# Patient Record
Sex: Female | Born: 1950 | Race: White | Hispanic: No | Smoking: Never smoker
Health system: Southern US, Community
[De-identification: ages and names within clinical notes are randomized; demographics above are authoritative.]

## PROBLEM LIST (undated history)

## (undated) DIAGNOSIS — I700 Atherosclerosis of aorta: Secondary | ICD-10-CM

## (undated) DIAGNOSIS — K219 Gastro-esophageal reflux disease without esophagitis: Secondary | ICD-10-CM

## (undated) DIAGNOSIS — K648 Other hemorrhoids: Secondary | ICD-10-CM

## (undated) DIAGNOSIS — T7840XA Allergy, unspecified, initial encounter: Secondary | ICD-10-CM

## (undated) DIAGNOSIS — H269 Unspecified cataract: Secondary | ICD-10-CM

## (undated) DIAGNOSIS — R011 Cardiac murmur, unspecified: Secondary | ICD-10-CM

## (undated) DIAGNOSIS — K602 Anal fissure, unspecified: Secondary | ICD-10-CM

## (undated) DIAGNOSIS — K449 Diaphragmatic hernia without obstruction or gangrene: Secondary | ICD-10-CM

## (undated) DIAGNOSIS — M810 Age-related osteoporosis without current pathological fracture: Secondary | ICD-10-CM

## (undated) HISTORY — DX: Allergy, unspecified, initial encounter: T78.40XA

## (undated) HISTORY — DX: Unspecified cataract: H26.9

## (undated) HISTORY — PX: BREAST ENHANCEMENT SURGERY: SHX7

## (undated) HISTORY — PX: WISDOM TOOTH EXTRACTION: SHX21

## (undated) HISTORY — DX: Other hemorrhoids: K64.8

## (undated) HISTORY — PX: COSMETIC SURGERY: SHX468

## (undated) HISTORY — DX: Age-related osteoporosis without current pathological fracture: M81.0

## (undated) HISTORY — DX: Gastro-esophageal reflux disease without esophagitis: K21.9

## (undated) HISTORY — PX: BASAL CELL CARCINOMA EXCISION: SHX1214

## (undated) HISTORY — PX: TUBAL LIGATION: SHX77

## (undated) HISTORY — PX: EYE SURGERY: SHX253

## (undated) HISTORY — DX: Anal fissure, unspecified: K60.2

## (undated) HISTORY — PX: AUGMENTATION MAMMAPLASTY: SUR837

## (undated) HISTORY — DX: Cardiac murmur, unspecified: R01.1

## (undated) HISTORY — DX: Diaphragmatic hernia without obstruction or gangrene: K44.9

## (undated) HISTORY — PX: COLONOSCOPY: SHX174

## (undated) HISTORY — DX: Atherosclerosis of aorta: I70.0

---

## 2017-01-21 ENCOUNTER — Ambulatory Visit (INDEPENDENT_AMBULATORY_CARE_PROVIDER_SITE_OTHER): Payer: Medicare Other | Admitting: Obstetrics & Gynecology

## 2017-01-21 ENCOUNTER — Encounter: Payer: Self-pay | Admitting: Obstetrics & Gynecology

## 2017-01-21 ENCOUNTER — Other Ambulatory Visit: Payer: Self-pay | Admitting: Obstetrics & Gynecology

## 2017-01-21 VITALS — BP 122/55 | HR 83 | Ht 64.0 in | Wt 127.7 lb

## 2017-01-21 DIAGNOSIS — Z01419 Encounter for gynecological examination (general) (routine) without abnormal findings: Secondary | ICD-10-CM

## 2017-01-21 DIAGNOSIS — Z0000 Encounter for general adult medical examination without abnormal findings: Secondary | ICD-10-CM | POA: Diagnosis not present

## 2017-01-21 DIAGNOSIS — Z1231 Encounter for screening mammogram for malignant neoplasm of breast: Secondary | ICD-10-CM

## 2017-01-21 NOTE — Progress Notes (Signed)
Subjective:     Virginia Fox is a 66 y.o. female here for a routine exam.  G2P2 LMP in her early 76's.  Current complaints: none.  Widowed. Last sexually active 2004. No FH of breast CA an any female CA.  No bleeding since menopause.   Gynecologic History No LMP recorded. Contraception: post menopausal status Last Pap: 2015. Results were: normal Last mammogram: 2016. Results were: normal  Obstetric History OB History  Gravida Para Term Preterm AB Living  2 2 2  0 0 2  SAB TAB Ectopic Multiple Live Births  0 0 0 0 2    # Outcome Date GA Lbr Len/2nd Weight Sex Delivery Anes PTL Lv  2 Term           1 Term              The following portions of the patient's history were reviewed and updated as appropriate: allergies, current medications, past family history, past medical history, past social history, past surgical history and problem list.  Review of Systems Pertinent items are noted in HPI.    Objective:   BP (!) 122/55   Pulse 83   Ht 5' 4"  (1.626 m)   Wt 127 lb 11.2 oz (57.9 kg)   BMI 21.92 kg/m  General Appearance:    Alert, cooperative, no distress, appears stated age  Head:    Normocephalic, without obvious abnormality, atraumatic  Eyes:    conjunctiva/corneas clear, EOM's intact, both eyes  Ears:    Normal external ear canals, both ears  Nose:   Nares normal, septum midline, mucosa normal, no drainage    or sinus tenderness  Throat:   Lips, mucosa, and tongue normal; teeth and gums normal  Neck:   Supple, symmetrical, trachea midline, no adenopathy;    thyroid:  no enlargement/tenderness/nodules  Back:     Symmetric, no curvature, ROM normal, no CVA tenderness  Lungs:     Clear to auscultation bilaterally, respirations unlabored  Chest Wall:    No tenderness or deformity   Heart:    Regular rate and rhythm, S1 and S2 normal, no murmur, rub   or gallop  Breast Exam:    No tenderness, masses, or nipple abnormality  Abdomen:     Soft, non-tender, bowel sounds  active all four quadrants,    no masses, no organomegaly  Genitalia:    Normal female without lesion, discharge or tenderness     Extremities:   Extremities normal, atraumatic, no cyanosis or edema  Pulses:   2+ and symmetric all extremities  Skin:   Skin color, texture, turgor normal, no rashes or lesions     Assessment:    Healthy female exam.   Post menopausal state Breat cancer screening   Plan:    Mammogram ordered. Follow up in: 1 year.    PAP not indicated.  Jeanise Durfey L. Harraway-Smith, M.D., Cherlynn June

## 2017-02-12 ENCOUNTER — Ambulatory Visit
Admission: RE | Admit: 2017-02-12 | Discharge: 2017-02-12 | Disposition: A | Discharge status: Home / Self Care | Payer: Medicare Other | Source: Ambulatory Visit | Attending: Obstetrics & Gynecology | Admitting: Obstetrics & Gynecology

## 2017-02-12 DIAGNOSIS — Z1231 Encounter for screening mammogram for malignant neoplasm of breast: Secondary | ICD-10-CM

## 2017-02-16 ENCOUNTER — Other Ambulatory Visit: Payer: Self-pay | Admitting: Obstetrics & Gynecology

## 2017-02-16 DIAGNOSIS — R928 Other abnormal and inconclusive findings on diagnostic imaging of breast: Secondary | ICD-10-CM

## 2017-02-22 ENCOUNTER — Ambulatory Visit
Admission: RE | Admit: 2017-02-22 | Discharge: 2017-02-22 | Disposition: A | Discharge status: Home / Self Care | Payer: Medicare Other | Source: Ambulatory Visit | Attending: Obstetrics & Gynecology | Admitting: Obstetrics & Gynecology

## 2017-02-22 DIAGNOSIS — R928 Other abnormal and inconclusive findings on diagnostic imaging of breast: Secondary | ICD-10-CM

## 2018-01-26 ENCOUNTER — Other Ambulatory Visit: Payer: Self-pay | Admitting: Obstetrics & Gynecology

## 2018-01-26 DIAGNOSIS — Z1231 Encounter for screening mammogram for malignant neoplasm of breast: Secondary | ICD-10-CM

## 2018-02-16 ENCOUNTER — Ambulatory Visit
Admission: RE | Admit: 2018-02-16 | Discharge: 2018-02-16 | Disposition: A | Discharge status: Home / Self Care | Payer: Medicare Other | Source: Ambulatory Visit | Attending: Obstetrics & Gynecology | Admitting: Obstetrics & Gynecology

## 2018-02-16 DIAGNOSIS — Z1231 Encounter for screening mammogram for malignant neoplasm of breast: Secondary | ICD-10-CM

## 2018-03-30 ENCOUNTER — Encounter: Payer: Self-pay | Admitting: Obstetrics & Gynecology

## 2018-03-30 ENCOUNTER — Other Ambulatory Visit (HOSPITAL_COMMUNITY)
Admission: RE | Admit: 2018-03-30 | Discharge: 2018-03-30 | Disposition: A | Discharge status: Home / Self Care | Payer: Medicare Other | Source: Ambulatory Visit | Attending: Obstetrics & Gynecology | Admitting: Obstetrics & Gynecology

## 2018-03-30 ENCOUNTER — Ambulatory Visit (INDEPENDENT_AMBULATORY_CARE_PROVIDER_SITE_OTHER): Payer: Medicare Other | Admitting: Obstetrics & Gynecology

## 2018-03-30 VITALS — BP 128/51 | HR 80 | Ht 64.5 in | Wt 132.0 lb

## 2018-03-30 DIAGNOSIS — Z01419 Encounter for gynecological examination (general) (routine) without abnormal findings: Secondary | ICD-10-CM | POA: Diagnosis present

## 2018-03-30 DIAGNOSIS — N952 Postmenopausal atrophic vaginitis: Secondary | ICD-10-CM | POA: Diagnosis not present

## 2018-03-30 DIAGNOSIS — Z124 Encounter for screening for malignant neoplasm of cervix: Secondary | ICD-10-CM | POA: Diagnosis not present

## 2018-03-30 NOTE — Addendum Note (Signed)
Addended by: Phill Myron on: 03/30/2018 02:02 PM   Modules accepted: Orders

## 2018-03-30 NOTE — Progress Notes (Signed)
Subjective:     Virginia Fox is a 67 y.o. female here for a routine exam.  LMP early 52's. Current complaints: no gyn problems.  No urinary leakage. No vaginal dryness. Pt not sexually.     Gynecologic History No LMP recorded. Patient is postmenopausal. Contraception: post menopausal status Last Pap: 2016. Results were: normal Last mammogram: 02/16/2018. Results were: normal  Obstetric History OB History  Gravida Para Term Preterm AB Living  2 2 2  0 0 2  SAB TAB Ectopic Multiple Live Births  0 0 0 0 2    # Outcome Date GA Lbr Len/2nd Weight Sex Delivery Anes PTL Lv  2 Term      CS-Unspec     1 Term      CS-Unspec        The following portions of the patient's history were reviewed and updated as appropriate: allergies, current medications, past family history, past medical history, past social history, past surgical history and problem list.  Review of Systems Pertinent items are noted in HPI.    Objective:  BP (!) 128/51 (BP Location: Left Arm)   Pulse 80   Ht 5' 4.5" (1.638 m)   Wt 132 lb (59.9 kg)   BMI 22.31 kg/m  General Appearance:    Alert, cooperative, no distress, appears stated age  Head:    Normocephalic, without obvious abnormality, atraumatic  Eyes:    conjunctiva/corneas clear, EOM's intact, both eyes  Ears:    Normal external ear canals, both ears  Nose:   Nares normal, septum midline, mucosa normal, no drainage    or sinus tenderness  Throat:   Lips, mucosa, and tongue normal; teeth and gums normal  Neck:   Supple, symmetrical, trachea midline, no adenopathy;    thyroid:  no enlargement/tenderness/nodules  Back:     Symmetric, no curvature, ROM normal, no CVA tenderness  Lungs:     Clear to auscultation bilaterally, respirations unlabored  Chest Wall:    No tenderness or deformity   Heart:    Regular rate and rhythm, S1 and S2 normal, no murmur, rub   or gallop  Breast Exam:    No tenderness, masses, or nipple abnormality; implants noted  Abdomen:      Soft, non-tender, bowel sounds active all four quadrants,    no masses, no organomegaly  Genitalia:    Normal female without lesion, discharge or tenderness; vaginal and vulvar      Extremities:   Extremities normal, atraumatic, no cyanosis or edema  Pulses:   2+ and symmetric all extremities  Skin:   Skin color, texture, turgor normal, no rashes or lesions     Assessment:    Healthy female exam.   menopausal state Atrophic vaginitis- pt does not desire tx at present. Not symptomatic     Plan:    Follow up in: 1 year.    Need records from pts last colonoscopy  Waldemar Siegel L. Harraway-Smith, M.D., Cherlynn June

## 2018-03-30 NOTE — Patient Instructions (Signed)
Atrophic Vaginitis Atrophic vaginitis is a condition in which the tissues that line the vagina become dry and thin. This condition is most common in women who have stopped having regular menstrual periods (menopause). This usually starts when a woman is 54-69 years old. Estrogen helps to keep the vagina moist. It stimulates the vagina to produce a clear fluid that lubricates the vagina for sexual intercourse. This fluid also protects the vagina from infection. Lack of estrogen can cause the lining of the vagina to get thinner and dryer. The vagina may also shrink in size. It may become less elastic. Atrophic vaginitis tends to get worse over time as a woman's estrogen level drops. What are the causes? This condition is caused by the normal drop in estrogen that happens around the time of menopause. What increases the risk? Certain conditions or situations may lower a woman's estrogen level, which increases her risk of atrophic vaginitis. These include:  Taking medicine that blocks estrogen.  Having ovaries removed surgically.  Being treated for cancer with X-ray treatment (radiation) or medicines (chemotherapy).  Exercising very hard and often.  Having an eating disorder (anorexia).  Giving birth or breastfeeding.  Being over the age of 31.  Smoking.  What are the signs or symptoms? Symptoms of this condition include:  Pain, soreness, or bleeding during sexual intercourse (dyspareunia).  Vaginal burning, irritation, or itching.  Pain or bleeding during a vaginal examination using a speculum (pelvic exam).  Loss of interest in sexual activity.  Having burning pain when passing urine.  Vaginal discharge that is brown or yellow.  In some cases, there are no symptoms. How is this diagnosed? This condition is diagnosed with a medical history and physical exam. This will include a pelvic exam that checks whether the inside of your vagina appears pale, thin, or dry. Rarely, you may  also have other tests, including:  A urine test.  A test that checks the acid balance in your vaginal fluid (acid balance test).  How is this treated? Treatment for this condition may depend on the severity of your symptoms. Treatment may include:  Using an over-the-counter vaginal lubricant before you have sexual intercourse.  Using a long-acting vaginal moisturizer.  Using low-dose vaginal estrogen for moderate to severe symptoms that do not respond to other treatments. Options include creams, tablets, and inserts (vaginal rings). Before using vaginal estrogen, tell your health care provider if you have a history of: ? Breast cancer. ? Endometrial cancer. ? Blood clots.  Taking medicines. You may be able to take a daily pill for dyspareunia. Discuss all of the risks of this medicine with your health care provider. It is usually not recommended for women who have a family history or personal history of breast cancer.  If your symptoms are very mild and you are not sexually active, you may not need treatment. Follow these instructions at home:  Take medicines only as directed by your health care provider. Do not use herbal or alternative medicines unless your health care provider says that you can.  Use over-the-counter creams, lubricants, or moisturizers for dryness only as directed by your health care provider.  If your atrophic vaginitis is caused by menopause, discuss all of your menopausal symptoms and treatment options with your health care provider.  Do not douche.  Do not use products that can make your vagina dry. These include: ? Scented feminine sprays. ? Scented tampons. ? Scented soaps.  If it hurts to have sex, talk with your sexual  partner. Contact a health care provider if:  Your discharge looks different than normal.  Your vagina has an unusual smell.  You have new symptoms.  Your symptoms do not improve with treatment.  Your symptoms get worse. This  information is not intended to replace advice given to you by your health care provider. Make sure you discuss any questions you have with your health care provider. Document Released: 04/09/2015 Document Revised: 04/30/2016 Document Reviewed: 11/14/2014 Elsevier Interactive Patient Education  [employer].

## 2018-04-01 LAB — CYTOLOGY - PAP
Diagnosis: NEGATIVE
HPV: NOT DETECTED

## 2019-04-24 ENCOUNTER — Other Ambulatory Visit: Payer: Self-pay | Admitting: Obstetrics & Gynecology

## 2019-04-24 DIAGNOSIS — Z1231 Encounter for screening mammogram for malignant neoplasm of breast: Secondary | ICD-10-CM

## 2019-04-26 DIAGNOSIS — H26492 Other secondary cataract, left eye: Secondary | ICD-10-CM | POA: Diagnosis not present

## 2019-04-28 DIAGNOSIS — M810 Age-related osteoporosis without current pathological fracture: Secondary | ICD-10-CM | POA: Diagnosis not present

## 2019-06-16 ENCOUNTER — Ambulatory Visit
Admission: RE | Admit: 2019-06-16 | Discharge: 2019-06-16 | Disposition: A | Discharge status: Home / Self Care | Payer: PPO | Source: Ambulatory Visit | Attending: Obstetrics & Gynecology | Admitting: Obstetrics & Gynecology

## 2019-06-16 DIAGNOSIS — Z1231 Encounter for screening mammogram for malignant neoplasm of breast: Secondary | ICD-10-CM

## 2019-07-25 DIAGNOSIS — L821 Other seborrheic keratosis: Secondary | ICD-10-CM | POA: Diagnosis not present

## 2019-07-25 DIAGNOSIS — L578 Other skin changes due to chronic exposure to nonionizing radiation: Secondary | ICD-10-CM | POA: Diagnosis not present

## 2019-07-25 DIAGNOSIS — D2371 Other benign neoplasm of skin of right lower limb, including hip: Secondary | ICD-10-CM | POA: Diagnosis not present

## 2019-07-25 DIAGNOSIS — L570 Actinic keratosis: Secondary | ICD-10-CM | POA: Diagnosis not present

## 2019-07-25 DIAGNOSIS — Z85828 Personal history of other malignant neoplasm of skin: Secondary | ICD-10-CM | POA: Diagnosis not present

## 2019-09-04 DIAGNOSIS — K649 Unspecified hemorrhoids: Secondary | ICD-10-CM | POA: Diagnosis not present

## 2019-09-27 DIAGNOSIS — Z23 Encounter for immunization: Secondary | ICD-10-CM | POA: Diagnosis not present

## 2019-11-09 DIAGNOSIS — M810 Age-related osteoporosis without current pathological fracture: Secondary | ICD-10-CM | POA: Diagnosis not present

## 2019-12-26 ENCOUNTER — Ambulatory Visit: Payer: PPO | Attending: Internal Medicine

## 2019-12-26 DIAGNOSIS — Z20822 Contact with and (suspected) exposure to covid-19: Secondary | ICD-10-CM

## 2019-12-27 LAB — NOVEL CORONAVIRUS, NAA: SARS-CoV-2, NAA: NOT DETECTED

## 2020-01-14 ENCOUNTER — Ambulatory Visit: Payer: PPO | Attending: Internal Medicine

## 2020-01-14 DIAGNOSIS — Z23 Encounter for immunization: Secondary | ICD-10-CM | POA: Insufficient documentation

## 2020-01-14 NOTE — Progress Notes (Signed)
   Covid-19 Vaccination Clinic  Name:  Virginia Fox    MRN: [id number] DOB: 13-Jul-1951  01/14/2020  [name] was observed post Covid-19 immunization for 15 minutes without incidence. She was provided with Vaccine Information Sheet and instruction to access the V-Safe system.   [name] was instructed to call 911 with any severe reactions post vaccine: Marland Kitchen Difficulty breathing  . Swelling of your face and throat  . A fast heartbeat  . A bad rash all over your body  . Dizziness and weakness    Immunizations Administered    Name Date Dose VIS Date Route   Pfizer COVID-19 Vaccine 01/14/2020  3:24 PM 0.3 mL 11/17/2019 Intramuscular   Manufacturer: [place]   Lot: [id number]   [place]: [address]-3

## 2020-01-30 ENCOUNTER — Ambulatory Visit: Payer: PPO

## 2020-02-08 ENCOUNTER — Ambulatory Visit: Payer: PPO | Attending: Internal Medicine

## 2020-02-08 DIAGNOSIS — Z23 Encounter for immunization: Secondary | ICD-10-CM | POA: Insufficient documentation

## 2020-02-08 NOTE — Progress Notes (Signed)
   Covid-19 Vaccination Clinic  Name:  Virginia Fox    MRN: [id number] DOB: 1951/07/11  02/08/2020  [name] was observed post Covid-19 immunization for 15 minutes without incident. She was provided with Vaccine Information Sheet and instruction to access the V-Safe system.   [name] was instructed to call 911 with any severe reactions post vaccine: Marland Kitchen Difficulty breathing  . Swelling of face and throat  . A fast heartbeat  . A bad rash all over body  . Dizziness and weakness   Immunizations Administered    Name Date Dose VIS Date Route   Pfizer COVID-19 Vaccine 02/08/2020  8:57 AM 0.3 mL 11/17/2019 Intramuscular   Manufacturer: [place]   Lot: [id number]   [place]: [address]-5

## 2020-02-19 DIAGNOSIS — R202 Paresthesia of skin: Secondary | ICD-10-CM | POA: Diagnosis not present

## 2020-02-19 DIAGNOSIS — R799 Abnormal finding of blood chemistry, unspecified: Secondary | ICD-10-CM | POA: Diagnosis not present

## 2020-02-19 DIAGNOSIS — Z13220 Encounter for screening for lipoid disorders: Secondary | ICD-10-CM | POA: Diagnosis not present

## 2020-02-19 DIAGNOSIS — Z1329 Encounter for screening for other suspected endocrine disorder: Secondary | ICD-10-CM | POA: Diagnosis not present

## 2020-02-19 DIAGNOSIS — Z130 Encounter for screening for diseases of the blood and blood-forming organs and certain disorders involving the immune mechanism: Secondary | ICD-10-CM | POA: Diagnosis not present

## 2020-02-19 DIAGNOSIS — Z0000 Encounter for general adult medical examination without abnormal findings: Secondary | ICD-10-CM | POA: Diagnosis not present

## 2020-02-19 DIAGNOSIS — Z1389 Encounter for screening for other disorder: Secondary | ICD-10-CM | POA: Diagnosis not present

## 2020-03-29 DIAGNOSIS — M791 Myalgia, unspecified site: Secondary | ICD-10-CM | POA: Diagnosis not present

## 2020-03-29 DIAGNOSIS — R202 Paresthesia of skin: Secondary | ICD-10-CM | POA: Diagnosis not present

## 2020-03-29 DIAGNOSIS — R200 Anesthesia of skin: Secondary | ICD-10-CM | POA: Diagnosis not present

## 2020-03-29 DIAGNOSIS — M79675 Pain in left toe(s): Secondary | ICD-10-CM | POA: Diagnosis not present

## 2020-05-07 DIAGNOSIS — H5211 Myopia, right eye: Secondary | ICD-10-CM | POA: Diagnosis not present

## 2020-05-07 DIAGNOSIS — H18593 Other hereditary corneal dystrophies, bilateral: Secondary | ICD-10-CM | POA: Diagnosis not present

## 2020-05-07 DIAGNOSIS — M5417 Radiculopathy, lumbosacral region: Secondary | ICD-10-CM | POA: Diagnosis not present

## 2020-05-07 DIAGNOSIS — R202 Paresthesia of skin: Secondary | ICD-10-CM | POA: Diagnosis not present

## 2020-05-22 DIAGNOSIS — M5417 Radiculopathy, lumbosacral region: Secondary | ICD-10-CM | POA: Diagnosis not present

## 2020-05-22 DIAGNOSIS — R202 Paresthesia of skin: Secondary | ICD-10-CM | POA: Diagnosis not present

## 2020-06-17 DIAGNOSIS — M810 Age-related osteoporosis without current pathological fracture: Secondary | ICD-10-CM | POA: Diagnosis not present

## 2020-07-24 DIAGNOSIS — H1031 Unspecified acute conjunctivitis, right eye: Secondary | ICD-10-CM | POA: Diagnosis not present

## 2020-07-25 DIAGNOSIS — D239 Other benign neoplasm of skin, unspecified: Secondary | ICD-10-CM | POA: Diagnosis not present

## 2020-07-25 DIAGNOSIS — L905 Scar conditions and fibrosis of skin: Secondary | ICD-10-CM | POA: Diagnosis not present

## 2020-07-25 DIAGNOSIS — L578 Other skin changes due to chronic exposure to nonionizing radiation: Secondary | ICD-10-CM | POA: Diagnosis not present

## 2020-07-25 DIAGNOSIS — D1801 Hemangioma of skin and subcutaneous tissue: Secondary | ICD-10-CM | POA: Diagnosis not present

## 2020-07-25 DIAGNOSIS — L821 Other seborrheic keratosis: Secondary | ICD-10-CM | POA: Diagnosis not present

## 2020-07-25 DIAGNOSIS — L814 Other melanin hyperpigmentation: Secondary | ICD-10-CM | POA: Diagnosis not present

## 2020-07-25 DIAGNOSIS — L570 Actinic keratosis: Secondary | ICD-10-CM | POA: Diagnosis not present

## 2020-07-25 DIAGNOSIS — X32XXXS Exposure to sunlight, sequela: Secondary | ICD-10-CM | POA: Diagnosis not present

## 2020-07-25 DIAGNOSIS — Z85828 Personal history of other malignant neoplasm of skin: Secondary | ICD-10-CM | POA: Diagnosis not present

## 2020-07-26 ENCOUNTER — Other Ambulatory Visit: Payer: Self-pay | Admitting: Obstetrics & Gynecology

## 2020-07-26 DIAGNOSIS — Z1231 Encounter for screening mammogram for malignant neoplasm of breast: Secondary | ICD-10-CM

## 2020-07-31 DIAGNOSIS — H1031 Unspecified acute conjunctivitis, right eye: Secondary | ICD-10-CM | POA: Diagnosis not present

## 2020-08-06 ENCOUNTER — Ambulatory Visit
Admission: RE | Admit: 2020-08-06 | Discharge: 2020-08-06 | Disposition: A | Discharge status: Home / Self Care | Payer: PPO | Source: Ambulatory Visit

## 2020-08-06 ENCOUNTER — Other Ambulatory Visit: Payer: Self-pay

## 2020-08-06 DIAGNOSIS — Z1231 Encounter for screening mammogram for malignant neoplasm of breast: Secondary | ICD-10-CM | POA: Diagnosis not present

## 2020-08-19 DIAGNOSIS — H1031 Unspecified acute conjunctivitis, right eye: Secondary | ICD-10-CM | POA: Diagnosis not present

## 2020-09-02 DIAGNOSIS — H1031 Unspecified acute conjunctivitis, right eye: Secondary | ICD-10-CM | POA: Diagnosis not present

## 2020-12-18 ENCOUNTER — Encounter: Payer: Self-pay | Admitting: Family Medicine

## 2020-12-18 ENCOUNTER — Other Ambulatory Visit: Payer: Self-pay

## 2020-12-18 ENCOUNTER — Ambulatory Visit (INDEPENDENT_AMBULATORY_CARE_PROVIDER_SITE_OTHER): Payer: PPO | Admitting: Family Medicine

## 2020-12-18 VITALS — BP 126/70 | HR 80 | Temp 98.4°F | Ht 65.0 in | Wt 134.8 lb

## 2020-12-18 DIAGNOSIS — Z23 Encounter for immunization: Secondary | ICD-10-CM | POA: Diagnosis not present

## 2020-12-18 DIAGNOSIS — Z7689 Persons encountering health services in other specified circumstances: Secondary | ICD-10-CM

## 2020-12-18 DIAGNOSIS — M810 Age-related osteoporosis without current pathological fracture: Secondary | ICD-10-CM | POA: Diagnosis not present

## 2020-12-18 NOTE — Patient Instructions (Signed)
We will contact the Prolia representative to let them know new office, confirm dates of approval and when injections are due, and what cost will be/continue to be. My office Verline Lema or Drue Dun) will call you once this is done to set up RN appt for injection

## 2020-12-18 NOTE — Progress Notes (Signed)
Virginia Fox is a 70 y.o. female  Chief Complaint  Patient presents with  . Establish Care    NP- establish care.  Also c/o having a sore spot on the LT side under jaw off/on.  She is also due for her Prolia injection.  Wants flu shot today.       HPI: Virginia Fox is a 70 y.o. female seen today as a new patient to establish care with our office.  She has a PMHx significant for osteoporosis for which she is on q68moprolia injections x 4 years She also takes OTC calcium and Vit D supplements. She is due for prolia injection.  Last dexa: 2019  Last mammo: 08/2020 - GYN Dr. HIhor Dow Last colonoscopy: 06/2015 - Dr. SLorin Mercyin [place]anden FVirginia- due in 06/2025  Med refills needed today: no  She would like her flu shot today.   She complains of a sore spot under her Lt jaw on/off x 3 mo. At times, laying or sleeping on her Lt side was uncomfortable. No facial or jaw swelling. No fever, chills. No symptoms or palpable issue x 1 week. Saw dentist for routine appt and all was fine.   Past Medical History:  Diagnosis Date  . Allergy   . Heart murmur   . Osteoporosis     Past Surgical History:  Procedure Laterality Date  . AUGMENTATION MAMMAPLASTY Bilateral   . BREAST ENHANCEMENT SURGERY    . CESAREAN SECTION      Social History   Socioeconomic History  . Marital status: Widowed    Spouse name: Not on file  . Number of children: Not on file  . Years of education: Not on file  . Highest education level: Not on file  Occupational History  . Not on file  Tobacco Use  . Smoking status: Never Smoker  . Smokeless tobacco: Never Used  Vaping Use  . Vaping Use: Never used  Substance and Sexual Activity  . Alcohol use: Yes    Comment: socially  . Drug use: No  . Sexual activity: Not Currently  Other Topics Concern  . Not on file  Social History Narrative  . Not on file   Social Determinants of Health   Financial Resource Strain: Not on file  Food  Insecurity: Not on file  Transportation Needs: Not on file  Physical Activity: Not on file  Stress: Not on file  Social Connections: Not on file  Intimate Partner Violence: Not on file    Family History  Problem Relation Age of Onset  . Diabetes Father   . Hypertension Father   . Multiple sclerosis Mother      Immunization History  Administered Date(s) Administered  . PFIZER SARS-COV-2 Vaccination 01/14/2020, 02/08/2020, 09/19/2020    Outpatient Encounter Medications as of 12/18/2020  Medication Sig  . cholecalciferol (VITAMIN D) 400 units TABS tablet Take 400 Units by mouth.  . denosumab (PROLIA) 60 MG/ML SOLN injection Inject 60 mg into the skin every 6 (six) months. Administer in upper arm, thigh, or abdomen  . loratadine (CLARITIN) 10 MG tablet Take 10 mg by mouth daily.  . Multiple Vitamins-Minerals (MULTIVITAMIN WITH MINERALS) tablet Take 1 tablet by mouth daily.  .Marland Kitchenomega-3 acid ethyl esters (LOVAZA) 1 g capsule Take by mouth 2 (two) times daily.  . [DISCONTINUED] calcium carbonate (OS-CAL - DOSED IN MG OF ELEMENTAL CALCIUM) 1250 (500 Ca) MG tablet Take 1 tablet by mouth. (Patient not taking: Reported on  12/18/2020)   No facility-administered encounter medications on file as of 12/18/2020.     ROS: Pertinent positives and negatives noted in HPI. Remainder of ROS non-contributory    No Known Allergies  BP 126/70   Pulse 80   Temp 98.4 F (36.9 C) (Temporal)   Ht 5' 5"  (1.651 m)   Wt 134 lb 12.8 oz (61.1 kg)   SpO2 99%   BMI 22.43 kg/m   Physical Exam Constitutional:      General: She is not in acute distress.    Appearance: Normal appearance. She is not ill-appearing.  Pulmonary:     Effort: No respiratory distress.  Neurological:     Mental Status: She is alert and oriented to person, place, and time.  Psychiatric:        Mood and Affect: Mood normal.        Behavior: Behavior normal.     A/P:  1. Encounter to establish care with new doctor  2.  Age-related osteoporosis without current pathological fracture - on prolia q18mo- our office will contact prolia rep to get pt set for injections at this office and then call pt to set up appt - cont Vit D and calcium supplements - DG Bone Density; Future    This visit occurred during the SARS-CoV-2 public health emergency.  Safety protocols were in place, including screening questions prior to the visit, additional usage of staff PPE, and extensive cleaning of exam room while observing appropriate contact time as indicated for disinfecting solutions.

## 2020-12-18 NOTE — Addendum Note (Signed)
Addended by: Konrad Saha on: 12/18/2020 10:55 AM   Modules accepted: Orders

## 2021-01-02 ENCOUNTER — Other Ambulatory Visit: Payer: Self-pay | Admitting: Family Medicine

## 2021-01-02 DIAGNOSIS — Z13820 Encounter for screening for osteoporosis: Secondary | ICD-10-CM

## 2021-01-06 ENCOUNTER — Telehealth: Payer: Self-pay

## 2021-01-06 NOTE — Telephone Encounter (Signed)
Pt calling to set up a bone density test and was told that she would need to schedule where she got it done previously.   [facility], [place], [place] Please advise.  CB#[phone]

## 2021-01-08 NOTE — Telephone Encounter (Signed)
Liane Comber, can you please change this bone density order to the preferred location?

## 2021-01-09 NOTE — Telephone Encounter (Signed)
[facility]ed to [facility] 01/09/21

## 2021-01-15 ENCOUNTER — Encounter: Payer: Self-pay | Admitting: Family Medicine

## 2021-01-15 DIAGNOSIS — Z780 Asymptomatic menopausal state: Secondary | ICD-10-CM | POA: Diagnosis not present

## 2021-01-15 DIAGNOSIS — M8589 Other specified disorders of bone density and structure, multiple sites: Secondary | ICD-10-CM | POA: Diagnosis not present

## 2021-01-15 DIAGNOSIS — M8580 Other specified disorders of bone density and structure, unspecified site: Secondary | ICD-10-CM | POA: Diagnosis not present

## 2021-01-15 LAB — HM DEXA SCAN

## 2021-01-27 ENCOUNTER — Encounter: Payer: Self-pay | Admitting: Family Medicine

## 2021-01-27 NOTE — Telephone Encounter (Signed)
No, this is my first time hearing about Prolia for pt.  I will get started on faxing her paperwork in. Let pt know that I will contact her as soon as I hear back from her insurance.

## 2021-02-11 DIAGNOSIS — L570 Actinic keratosis: Secondary | ICD-10-CM | POA: Diagnosis not present

## 2021-03-10 ENCOUNTER — Telehealth: Payer: Self-pay

## 2021-03-10 NOTE — Telephone Encounter (Signed)
I spoke with pt about Prolia injection and her OOP cost.  Pt is scheduled for 03/18/21.  $280 cost

## 2021-03-17 NOTE — Patient Instructions (Signed)
Health Maintenance Due  Topic Date Due  . Hepatitis C Screening  Never done  . PNA vac Low Risk Adult (1 of 2 - PCV13) Never done    Depression screen [facility] 2/9 12/18/2020 01/21/2017  Decreased Interest 0 0  Down, Depressed, Hopeless 0 0  PHQ - 2 Score 0 0  Altered sleeping - 0  Tired, decreased energy - 0  Change in appetite - 0  Feeling bad or failure about yourself  - 0  Trouble concentrating - 0  Moving slowly or fidgety/restless - 0  Suicidal thoughts - 0  PHQ-9 Score - 0

## 2021-03-18 ENCOUNTER — Other Ambulatory Visit: Payer: Self-pay

## 2021-03-18 ENCOUNTER — Ambulatory Visit (INDEPENDENT_AMBULATORY_CARE_PROVIDER_SITE_OTHER): Payer: PPO

## 2021-03-18 DIAGNOSIS — M810 Age-related osteoporosis without current pathological fracture: Secondary | ICD-10-CM

## 2021-03-18 MED ORDER — DENOSUMAB 60 MG/ML ~~LOC~~ SOSY
60.0000 mg | PREFILLED_SYRINGE | Freq: Once | SUBCUTANEOUS | Status: AC
  Administered 2021-03-18: 60 mg via SUBCUTANEOUS

## 2021-03-18 NOTE — Progress Notes (Signed)
Per orders of Dr. Bryan Lemma injection of Prolia given by Verline Lema L Evgenia Merriman in left arm. Patient tolerated injection well. No signs or symptoms of a reaction were noted prior to patient leaving the nurse visit. Patient will make appointment for 6 months.

## 2021-03-24 NOTE — Progress Notes (Signed)
Subjective:   Virginia Fox is a 70 y.o. female who presents for Medicare Annual (Subsequent) preventive examination.  I connected with  Amenah today by a video enabled telemedicine application and verified that I am speaking with the correct person using two identifiers.  Location of patient:Home Location of provider:Work  Persons participating in the virtual visit: patient, nurse.   I discussed the limitations, risk, security and privacy concerns of evaluation and management by telemedicine. The patient expressed understanding and agreed to proceed.  Some vital signs may be absent or patient reported.   Review of Systems     Cardiac Risk Factors include: advanced age (>61mn, >>72women)     Objective:    Today's Vitals   03/25/21 1244  Weight: 134 lb (60.8 kg)  Height: 5' 5"  (1.651 m)   Body mass index is 22.3 kg/m.  Advanced Directives 03/25/2021 01/21/2017  Does Patient Have a Medical Advance Directive? Yes Yes  Type of AParamedicof AGreen HillLiving will Living will  Copy of [place] Chart? No - copy requested -    Current Medications (verified) Outpatient Encounter Medications as of 03/25/2021  Medication Sig  . cholecalciferol (VITAMIN D) 400 units TABS tablet Take 400 Units by mouth.  . denosumab (PROLIA) 60 MG/ML SOLN injection Inject 60 mg into the skin every 6 (six) months. Administer in upper arm, thigh, or abdomen  . loratadine (CLARITIN) 10 MG tablet Take 10 mg by mouth daily.  . Multiple Vitamins-Minerals (MULTIVITAMIN WITH MINERALS) tablet Take 1 tablet by mouth daily.  .Marland Kitchenomega-3 acid ethyl esters (LOVAZA) 1 g capsule Take by mouth 2 (two) times daily.   No facility-administered encounter medications on file as of 03/25/2021.    Allergies (verified) Patient has no known allergies.   History: Past Medical History:  Diagnosis Date  . Allergy   . Heart murmur   . Osteoporosis    Past Surgical  History:  Procedure Laterality Date  . AUGMENTATION MAMMAPLASTY Bilateral   . BREAST ENHANCEMENT SURGERY    . CESAREAN SECTION     Family History  Problem Relation Age of Onset  . Diabetes Father   . Hypertension Father   . Multiple sclerosis Mother    Social History   Socioeconomic History  . Marital status: Widowed    Spouse name: Not on file  . Number of children: Not on file  . Years of education: Not on file  . Highest education level: Not on file  Occupational History  . Occupation: Retired  Tobacco Use  . Smoking status: Never Smoker  . Smokeless tobacco: Never Used  Vaping Use  . Vaping Use: Never used  Substance and Sexual Activity  . Alcohol use: Yes    Comment: socially  . Drug use: No  . Sexual activity: Not Currently  Other Topics Concern  . Not on file  Social History Narrative  . Not on file   Social Determinants of Health   Financial Resource Strain: Low Risk   . Difficulty of Paying Living Expenses: Not hard at all  Food Insecurity: No Food Insecurity  . Worried About RCharity fundraiserin the Last Year: Never true  . Ran Out of Food in the Last Year: Never true  Transportation Needs: No Transportation Needs  . Lack of Transportation (Medical): No  . Lack of Transportation (Non-Medical): No  Physical Activity: Sufficiently Active  . Days of Exercise per Week: 5 days  . Minutes  of Exercise per Session: 50 min  Stress: No Stress Concern Present  . Feeling of Stress : Not at all  Social Connections: Moderately Integrated  . Frequency of Communication with Friends and Family: More than three times a week  . Frequency of Social Gatherings with Friends and Family: More than three times a week  . Attends Religious Services: 1 to 4 times per year  . Active Member of Clubs or Organizations: Yes  . Attends Archivist Meetings: 1 to 4 times per year  . Marital Status: Widowed    Tobacco Counseling Counseling given: Not  Answered   Clinical Intake:  Pre-visit preparation completed: Yes  Pain : No/denies pain     Nutritional Status: BMI of 19-24  Normal Nutritional Risks: None Diabetes: No  How often do you need to have someone help you when you read instructions, pamphlets, or other written materials from your doctor or pharmacy?: 1 - Never  Diabetic?No  Interpreter Needed?: No  Information entered by :: Caroleen Hamman LPN   Activities of Daily Living In your present state of health, do you have any difficulty performing the following activities: 03/25/2021  Hearing? Y  Comment left ear  Vision? N  Difficulty concentrating or making decisions? N  Walking or climbing stairs? N  Dressing or bathing? N  Doing errands, shopping? N  Preparing Food and eating ? N  Using the Toilet? N  In the past six months, have you accidently leaked urine? N  Do you have problems with loss of bowel control? N  Managing your Medications? N  Managing your Finances? N  Housekeeping or managing your Housekeeping? N  Some recent data might be hidden    Patient Care Team: Ronnald Nian, DO as PCP - General (Family Medicine) Jola Schmidt, MD as Consulting Physician (Ophthalmology)  Indicate any recent Medical Services you may have received from other than [facility] providers in the past year (date may be approximate).     Assessment:   This is a routine wellness examination for Sharon.  Hearing/Vision screen  Hearing Screening   125Hz  250Hz  500Hz  1000Hz  2000Hz  3000Hz  4000Hz  6000Hz  8000Hz   Right ear:           Left ear:           Comments: Hearing loss in left ear  Vision Screening Comments: Last eye exam-2021-Dr. Bowen  Dietary issues and exercise activities discussed: Current Exercise Habits: Home exercise routine, Type of exercise: walking (exercise classes), Time (Minutes): 50, Frequency (Times/Week): 5, Weekly Exercise (Minutes/Week): 250, Intensity: Mild, Exercise limited by: None  identified  Goals    . Patient Stated     Maintain current healthy active lifestyle      Depression Screen PHQ 2/9 Scores 03/25/2021 12/18/2020 01/21/2017  PHQ - 2 Score 0 0 0  PHQ- 9 Score - - 0    Fall Risk Fall Risk  03/25/2021 12/18/2020  Falls in the past year? 0 0  Number falls in past yr: 0 0  Injury with Fall? 0 0  Follow up Falls prevention discussed -    FALL RISK PREVENTION PERTAINING TO THE HOME:  Any stairs in or around the home? No  Home free of loose throw rugs in walkways, pet beds, electrical cords, etc? Yes  Adequate lighting in your home to reduce risk of falls? Yes   ASSISTIVE DEVICES UTILIZED TO PREVENT FALLS:  Life alert? No  Use of a cane, walker or w/c? No  Grab bars in  the bathroom? No  Shower chair or bench in shower? No  Elevated toilet seat or a handicapped toilet? No   TIMED UP AND GO:  Was the test performed? No . Phone visit   Cognitive Function:Normal cognitive status assessed by direct observation by this Nurse Health Advisor. No abnormalities found.           Immunizations Immunization History  Administered Date(s) Administered  . Fluad Quad(high Dose 65+) 12/18/2020  . PFIZER(Purple Top)SARS-COV-2 Vaccination 01/14/2020, 02/08/2020, 09/19/2020  . Tdap 02/18/2014    TDAP status: Up to date  Flu Vaccine status: Up to date  Pneumococcal vaccine status: Due, Education has been provided regarding the importance of this vaccine. Advised may receive this vaccine at local pharmacy or Health Dept. Aware to provide a copy of the vaccination record if obtained from local pharmacy or Health Dept. Verbalized acceptance and understanding.  Covid-19 vaccine status: Completed vaccines  Qualifies for Shingles Vaccine? Yes   Zostavax completed No   Shingrix Completed?: No.    Education has been provided regarding the importance of this vaccine. Patient has been advised to call insurance company to determine out of pocket expense if they  have not yet received this vaccine. Advised may also receive vaccine at local pharmacy or Health Dept. Verbalized acceptance and understanding.  Screening Tests Health Maintenance  Topic Date Due  . Hepatitis C Screening  Never done  . PNA vac Low Risk Adult (1 of 2 - PCV13) Never done  . INFLUENZA VACCINE  07/07/2021  . MAMMOGRAM  08/06/2022  . TETANUS/TDAP  02/19/2024  . COLONOSCOPY (Pts 45-57yr Insurance coverage will need to be confirmed)  06/11/2025  . DEXA SCAN  Completed  . COVID-19 Vaccine  Completed  . HPV VACCINES  Aged Out    Health Maintenance  Health Maintenance Due  Topic Date Due  . Hepatitis C Screening  Never done  . PNA vac Low Risk Adult (1 of 2 - PCV13) Never done    Colorectal cancer screening: Type of screening: Colonoscopy. Completed 06/12/2015. Repeat every 10 years  Mammogram status: Completed Bilateral 08/06/2020. Repeat every year  Bone Density status: Completed 01/15/2021. Results reflect: Bone density results: OSTEOPENIA. Repeat every 2 years.  Lung Cancer Screening: (Low Dose CT Chest recommended if Age 72-80years, 30 pack-year currently smoking OR have quit w/in 15years.) does not qualify.     Additional Screening:  Hepatitis C Screening: does qualify; Discuss with PCP  Vision Screening: Recommended annual ophthalmology exams for early detection of glaucoma and other disorders of the eye. Is the patient up to date with their annual eye exam?  Yes  Who is the provider or what is the name of the office in which the patient attends annual eye exams? Dr. BValetta Close  Dental Screening: Recommended annual dental exams for proper oral hygiene  Community Resource Referral / Chronic Care Management: CRR required this visit?  No   CCM required this visit?  No      Plan:     I have personally reviewed and noted the following in the patient's chart:   . Medical and social history . Use of alcohol, tobacco or illicit drugs  . Current medications  and supplements . Functional ability and status . Nutritional status . Physical activity . Advanced directives . List of other physicians . Hospitalizations, surgeries, and ER visits in previous 12 months . Vitals . Screenings to include cognitive, depression, and falls . Referrals and appointments  In addition, I have  reviewed and discussed with patient certain preventive protocols, quality metrics, and best practice recommendations. A written personalized care plan for preventive services as well as general preventive health recommendations were provided to patient.   Due to this being a virtual visit, the after visit summary with patients personalized plan was offered to patient via mail or my-chart. Patient would like to access on my-chart.   Marta Antu, LPN   8/[id number]/1194  Nurse Health Advisor  Nurse Notes: None

## 2021-03-25 ENCOUNTER — Ambulatory Visit (INDEPENDENT_AMBULATORY_CARE_PROVIDER_SITE_OTHER): Payer: PPO

## 2021-03-25 VITALS — Ht 65.0 in | Wt 134.0 lb

## 2021-03-25 DIAGNOSIS — Z0000 Encounter for general adult medical examination without abnormal findings: Secondary | ICD-10-CM | POA: Diagnosis not present

## 2021-03-25 NOTE — Patient Instructions (Signed)
[name] , Thank you for taking time to complete your Medicare Wellness Visit. I appreciate your ongoing commitment to your health goals. Please review the following plan we discussed and let me know if I can assist you in the future.   Screening recommendations/referrals: Colonoscopy: Completed 06/12/2015-Due 06/21/2025 Mammogram: Completed 08/06/2020-Due 08/06/2021 Bone Density: Completed 01/15/2021-Due 01/15/2023 Recommended yearly ophthalmology/optometry visit for glaucoma screening and checkup Recommended yearly dental visit for hygiene and checkup  Vaccinations: Influenza vaccine: Up to date Pneumococcal vaccine: Due-May obtain vaccine at our office or your local pharmacy. Tdap vaccine: Up to date-Due-02/19/2024 Shingles vaccine: Discuss with pharmacy   Covid-19:Completed vaccines  Advanced directives: please bring a copy for your chart  Conditions/risks identified: See problem list  Next appointment: Follow up in one year for your annual wellness visit    Preventive Care 72 Years and Older, Female Preventive care refers to lifestyle choices and visits with your health care provider that can promote health and wellness. What does preventive care include?  A yearly physical exam. This is also called an annual well check.  Dental exams once or twice a year.  Routine eye exams. Ask your health care provider how often you should have your eyes checked.  Personal lifestyle choices, including:  Daily care of your teeth and gums.  Regular physical activity.  Eating a healthy diet.  Avoiding tobacco and drug use.  Limiting alcohol use.  Practicing safe sex.  Taking low-dose aspirin every day.  Taking vitamin and mineral supplements as recommended by your health care provider. What happens during an annual well check? The services and screenings done by your health care provider during your annual well check will depend on your age, overall health, lifestyle risk factors,  and family history of disease. Counseling  Your health care provider may ask you questions about your:  Alcohol use.  Tobacco use.  Drug use.  Emotional well-being.  Home and relationship well-being.  Sexual activity.  Eating habits.  History of falls.  Memory and ability to understand (cognition).  Work and work Statistician.  Reproductive health. Screening  You may have the following tests or measurements:  Height, weight, and BMI.  Blood pressure.  Lipid and cholesterol levels. These may be checked every 5 years, or more frequently if you are over 45 years old.  Skin check.  Lung cancer screening. You may have this screening every year starting at age 72 if you have a 30-pack-year history of smoking and currently smoke or have quit within the past 15 years.  Fecal occult blood test (FOBT) of the stool. You may have this test every year starting at age 72.  Flexible sigmoidoscopy or colonoscopy. You may have a sigmoidoscopy every 5 years or a colonoscopy every 10 years starting at age 72.  Hepatitis C blood test.  Hepatitis B blood test.  Sexually transmitted disease (STD) testing.  Diabetes screening. This is done by checking your blood sugar (glucose) after you have not eaten for a while (fasting). You may have this done every 1-3 years.  Bone density scan. This is done to screen for osteoporosis. You may have this done starting at age 37.  Mammogram. This may be done every 1-2 years. Talk to your health care provider about how often you should have regular mammograms. Talk with your health care provider about your test results, treatment options, and if necessary, the need for more tests. Vaccines  Your health care provider may recommend certain vaccines, such as:  Influenza vaccine.  This is recommended every year.  Tetanus, diphtheria, and acellular pertussis (Tdap, Td) vaccine. You may need a Td booster every 10 years.  Zoster vaccine. You may need  this after age 72.  Pneumococcal 13-valent conjugate (PCV13) vaccine. One dose is recommended after age 72.  Pneumococcal polysaccharide (PPSV23) vaccine. One dose is recommended after age 38. Talk to your health care provider about which screenings and vaccines you need and how often you need them. This information is not intended to replace advice given to you by your health care provider. Make sure you discuss any questions you have with your health care provider. Document Released: 12/20/2015 Document Revised: 08/12/2016 Document Reviewed: 09/24/2015 Elsevier Interactive Patient Education  2017 [place] Prevention in the Home Falls can cause injuries. They can happen to people of all ages. There are many things you can do to make your home safe and to help prevent falls. What can I do on the outside of my home?  Regularly fix the edges of walkways and driveways and fix any cracks.  Remove anything that might make you trip as you walk through a door, such as a raised step or threshold.  Trim any bushes or trees on the path to your home.  Use bright outdoor lighting.  Clear any walking paths of anything that might make someone trip, such as rocks or tools.  Regularly check to see if handrails are loose or broken. Make sure that both sides of any steps have handrails.  Any raised decks and porches should have guardrails on the edges.  Have any leaves, snow, or ice cleared regularly.  Use sand or salt on walking paths during winter.  Clean up any spills in your garage right away. This includes oil or grease spills. What can I do in the bathroom?  Use night lights.  Install grab bars by the toilet and in the tub and shower. Do not use towel bars as grab bars.  Use non-skid mats or decals in the tub or shower.  If you need to sit down in the shower, use a plastic, non-slip stool.  Keep the floor dry. Clean up any water that spills on the floor as soon as it  happens.  Remove soap buildup in the tub or shower regularly.  Attach bath mats securely with double-sided non-slip rug tape.  Do not have throw rugs and other things on the floor that can make you trip. What can I do in the bedroom?  Use night lights.  Make sure that you have a light by your bed that is easy to reach.  Do not use any sheets or blankets that are too big for your bed. They should not hang down onto the floor.  Have a firm chair that has side arms. You can use this for support while you get dressed.  Do not have throw rugs and other things on the floor that can make you trip. What can I do in the kitchen?  Clean up any spills right away.  Avoid walking on wet floors.  Keep items that you use a lot in easy-to-reach places.  If you need to reach something above you, use a strong step stool that has a grab bar.  Keep electrical cords out of the way.  Do not use floor polish or wax that makes floors slippery. If you must use wax, use non-skid floor wax.  Do not have throw rugs and other things on the floor that can make  you trip. What can I do with my stairs?  Do not leave any items on the stairs.  Make sure that there are handrails on both sides of the stairs and use them. Fix handrails that are broken or loose. Make sure that handrails are as long as the stairways.  Check any carpeting to make sure that it is firmly attached to the stairs. Fix any carpet that is loose or worn.  Avoid having throw rugs at the top or bottom of the stairs. If you do have throw rugs, attach them to the floor with carpet tape.  Make sure that you have a light switch at the top of the stairs and the bottom of the stairs. If you do not have them, ask someone to add them for you. What else can I do to help prevent falls?  Wear shoes that:  Do not have high heels.  Have rubber bottoms.  Are comfortable and fit you well.  Are closed at the toe. Do not wear sandals.  If you  use a stepladder:  Make sure that it is fully opened. Do not climb a closed stepladder.  Make sure that both sides of the stepladder are locked into place.  Ask someone to hold it for you, if possible.  Clearly mark and make sure that you can see:  Any grab bars or handrails.  First and last steps.  Where the edge of each step is.  Use tools that help you move around (mobility aids) if they are needed. These include:  Canes.  Walkers.  Scooters.  Crutches.  Turn on the lights when you go into a dark area. Replace any light bulbs as soon as they burn out.  Set up your furniture so you have a clear path. Avoid moving your furniture around.  If any of your floors are uneven, fix them.  If there are any pets around you, be aware of where they are.  Review your medicines with your doctor. Some medicines can make you feel dizzy. This can increase your chance of falling. Ask your doctor what other things that you can do to help prevent falls. This information is not intended to replace advice given to you by your health care provider. Make sure you discuss any questions you have with your health care provider. Document Released: 09/19/2009 Document Revised: 04/30/2016 Document Reviewed: 12/28/2014 Elsevier Interactive Patient Education  2017 [employer].

## 2021-04-03 DIAGNOSIS — L570 Actinic keratosis: Secondary | ICD-10-CM | POA: Diagnosis not present

## 2021-05-09 DIAGNOSIS — Z961 Presence of intraocular lens: Secondary | ICD-10-CM | POA: Diagnosis not present

## 2021-05-13 DIAGNOSIS — L249 Irritant contact dermatitis, unspecified cause: Secondary | ICD-10-CM | POA: Diagnosis not present

## 2021-05-21 ENCOUNTER — Other Ambulatory Visit: Payer: Self-pay

## 2021-05-22 ENCOUNTER — Ambulatory Visit (INDEPENDENT_AMBULATORY_CARE_PROVIDER_SITE_OTHER)
Admission: RE | Admit: 2021-05-22 | Discharge: 2021-05-22 | Disposition: A | Discharge status: Home / Self Care | Payer: PPO | Source: Ambulatory Visit | Attending: Family Medicine | Admitting: Family Medicine

## 2021-05-22 ENCOUNTER — Encounter: Payer: Self-pay | Admitting: Family Medicine

## 2021-05-22 ENCOUNTER — Ambulatory Visit (INDEPENDENT_AMBULATORY_CARE_PROVIDER_SITE_OTHER): Payer: PPO | Admitting: Family Medicine

## 2021-05-22 ENCOUNTER — Telehealth: Payer: Self-pay

## 2021-05-22 VITALS — BP 120/72 | HR 68 | Temp 98.3°F | Ht 65.0 in | Wt 132.2 lb

## 2021-05-22 DIAGNOSIS — Z23 Encounter for immunization: Secondary | ICD-10-CM

## 2021-05-22 DIAGNOSIS — R6884 Jaw pain: Secondary | ICD-10-CM

## 2021-05-22 DIAGNOSIS — Z131 Encounter for screening for diabetes mellitus: Secondary | ICD-10-CM | POA: Diagnosis not present

## 2021-05-22 DIAGNOSIS — Z1159 Encounter for screening for other viral diseases: Secondary | ICD-10-CM

## 2021-05-22 DIAGNOSIS — Z13220 Encounter for screening for lipoid disorders: Secondary | ICD-10-CM

## 2021-05-22 DIAGNOSIS — M810 Age-related osteoporosis without current pathological fracture: Secondary | ICD-10-CM

## 2021-05-22 LAB — COMPREHENSIVE METABOLIC PANEL
ALT: 13 U/L (ref 0–35)
AST: 17 U/L (ref 0–37)
Albumin: 4.3 g/dL (ref 3.5–5.2)
Alkaline Phosphatase: 63 U/L (ref 39–117)
BUN: 14 mg/dL (ref 6–23)
CO2: 29 mEq/L (ref 19–32)
Calcium: 9.4 mg/dL (ref 8.4–10.5)
Chloride: 103 mEq/L (ref 96–112)
Creatinine, Ser: 0.74 mg/dL (ref 0.40–1.20)
GFR: 81.22 mL/min (ref >60.00)
Glucose, Bld: 96 mg/dL (ref 70–99)
Potassium: 4.5 mEq/L (ref 3.5–5.1)
Sodium: 139 mEq/L (ref 135–145)
Total Bilirubin: 0.5 mg/dL (ref 0.2–1.2)
Total Protein: 6.4 g/dL (ref 6.0–8.3)

## 2021-05-22 LAB — VITAMIN D 25 HYDROXY (VIT D DEFICIENCY, FRACTURES): VITD: 118.84 ng/mL (ref 30.00–100.00)

## 2021-05-22 LAB — LIPID PANEL
Cholesterol: 210 mg/dL — ABNORMAL HIGH (ref 0–200)
HDL: 76.7 mg/dL (ref >39.00)
LDL Cholesterol: 124 mg/dL — ABNORMAL HIGH (ref 0–99)
NonHDL: 133.67
Total CHOL/HDL Ratio: 3
Triglycerides: 48 mg/dL (ref 0.0–149.0)
VLDL: 9.6 mg/dL (ref 0.0–40.0)

## 2021-05-22 NOTE — Telephone Encounter (Signed)
Lab result noted. Mychart message sent to pt recommending she stop OTC Vit D supplement

## 2021-05-22 NOTE — Telephone Encounter (Signed)
Per Marisue Brooklyn at [facility]:    pt has a critically high vitamin D. At 118.84   Please advise.

## 2021-05-22 NOTE — Progress Notes (Signed)
Virginia Fox is a 70 y.o. female  Chief Complaint  Patient presents with   Annual Exam    Pt is fasting for lab work.  Pt is due for Shingles vaccine and Pneumovax. Pt is UTD on mammogram and eye exam.  She would like to discuss lt side jaw pain and allergies.    HPI: Virginia Fox is a 70 y.o. female patient seen today for annual exam and f/u on chronic medical issues. She has a PMHx significant for osteoporosis for which she is on q50moprolia injections x 4 years She also takes OTC calcium and Vit D supplements. Last prolia injection was 03/18/2021. Last dexa: 01/15/2021 - osteopenia, T-score = -2.1 (improved compared to 10/2018) Last mammo: 08/2020 - GYN Dr. HIhor Dow Last colonoscopy: 06/2015 - Dr. [place]orin [place]- due in 06/2025 Dental: UTD Vision: UTD  She complains of a sore spot under her Lt jaw on/off x 8-9 mo. At times, laying or sleeping on her Lt side was uncomfortable. No facial or jaw swelling. No fever, chills.  Saw dentist for routine appt and all was fine.    Past Medical History:  Diagnosis Date   Allergy    Heart murmur    Osteoporosis     Past Surgical History:  Procedure Laterality Date   AUGMENTATION MAMMAPLASTY Bilateral    BREAST ENHANCEMENT SURGERY     CESAREAN SECTION      Social History   Socioeconomic History   Marital status: Widowed    Spouse name: Not on file   Number of children: Not on file   Years of education: Not on file   Highest education level: Not on file  Occupational History   Occupation: Retired  Tobacco Use   Smoking status: Never   Smokeless tobacco: Never  Vaping Use   Vaping Use: Never used  Substance and Sexual Activity   Alcohol use: Yes    Comment: socially   Drug use: No   Sexual activity: Not Currently  Other Topics Concern   Not on file  Social History Narrative   Not on file   Social Determinants of Health   Financial Resource Strain: Low Risk    Difficulty of Paying  Living Expenses: Not hard at all  Food Insecurity: No Food Insecurity   Worried About RCharity fundraiserin the Last Year: Never true   RHikoin the Last Year: Never true  Transportation Needs: No Transportation Needs   Lack of Transportation (Medical): No   Lack of Transportation (Non-Medical): No  Physical Activity: Sufficiently Active   Days of Exercise per Week: 5 days   Minutes of Exercise per Session: 50 min  Stress: No Stress Concern Present   Feeling of Stress : Not at all  Social Connections: Moderately Integrated   Frequency of Communication with Friends and Family: More than three times a week   Frequency of Social Gatherings with Friends and Family: More than three times a week   Attends Religious Services: 1 to 4 times per year   Active Member of [employer] Organizations: Yes   Attends CArchivistMeetings: 1 to 4 times per year   Marital Status: Widowed  IHuman resources officerViolence: Not At Risk   Fear of Current or Ex-Partner: No   Emotionally Abused: No   Physically Abused: No   Sexually Abused: No    Family History  Problem Relation Age of Onset  Diabetes Father    Hypertension Father    Multiple sclerosis Mother      Immunization History  Administered Date(s) Administered   Fluad Quad(high Dose 65+) 12/18/2020   PFIZER(Purple Top)SARS-COV-2 Vaccination 01/14/2020, 02/08/2020, 09/19/2020   Tdap 02/18/2014    Outpatient Encounter Medications as of 05/22/2021  Medication Sig   cetirizine (ZYRTEC) 10 MG tablet Take 10 mg by mouth daily.   cholecalciferol (VITAMIN D) 400 units TABS tablet Take 400 Units by mouth.   denosumab (PROLIA) 60 MG/ML SOLN injection Inject 60 mg into the skin every 6 (six) months. Administer in upper arm, thigh, or abdomen   Multiple Vitamins-Minerals (MULTIVITAMIN WITH MINERALS) tablet Take 1 tablet by mouth daily.   loratadine (CLARITIN) 10 MG tablet Take 10 mg by mouth daily. (Patient not taking: Reported on  05/22/2021)   [DISCONTINUED] omega-3 acid ethyl esters (LOVAZA) 1 g capsule Take by mouth 2 (two) times daily.   No facility-administered encounter medications on file as of 05/22/2021.     ROS: Gen: no fever, chills  Skin: no rash, itching ENT: no ear pain, ear drainage, nasal congestion, rhinorrhea, sinus pressure, sore throat Eyes: no blurry vision, double vision Resp: no cough, wheeze,SOB Breast: no breast tenderness, no nipple discharge, no breast masses CV: no CP, palpitations, LE edema,  GI: no heartburn, n/v/d/c, abd pain GU: no dysuria, urgency, frequency, hematuria MSK: no joint pain, myalgias, back pain Neuro: no dizziness, headache, weakness, vertigo Psych: no depression, anxiety, insomnia   No Known Allergies  BP 120/72 (BP Location: Left Arm, Patient Position: Sitting, Cuff Size: Normal)   Pulse 68   Temp 98.3 F (36.8 C) (Temporal)   Ht 5' 5"  (1.651 m)   Wt 132 lb 3.2 oz (60 kg)   SpO2 96%   BMI 22.00 kg/m  Wt Readings from Last 3 Encounters:  05/22/21 132 lb 3.2 oz (60 kg)  03/25/21 134 lb (60.8 kg)  12/18/20 134 lb 12.8 oz (61.1 kg)   Temp Readings from Last 3 Encounters:  05/22/21 98.3 F (36.8 C) (Temporal)  12/18/20 98.4 F (36.9 C) (Temporal)   BP Readings from Last 3 Encounters:  05/22/21 120/72  12/18/20 126/70  03/30/18 (!) 128/51   Pulse Readings from Last 3 Encounters:  05/22/21 68  12/18/20 80  03/30/18 80    Physical Exam Constitutional:      General: She is not in acute distress.    Appearance: She is well-developed.  HENT:     Head: Normocephalic and atraumatic.     Jaw: Tenderness present.      Right Ear: Tympanic membrane and ear canal normal.     Left Ear: Tympanic membrane and ear canal normal.     Nose: Nose normal.     Mouth/Throat:     Mouth: Mucous membranes are moist.     Pharynx: Oropharynx is clear.  Eyes:     Conjunctiva/sclera: Conjunctivae normal.  Neck:     Thyroid: No thyromegaly.  Cardiovascular:      Rate and Rhythm: Normal rate and regular rhythm.     Pulses: Normal pulses.  Pulmonary:     Effort: Pulmonary effort is normal. No respiratory distress.     Breath sounds: Normal breath sounds. No wheezing or rhonchi.  Abdominal:     General: Bowel sounds are normal. There is no distension.     Palpations: Abdomen is soft. There is no mass.     Tenderness: There is no abdominal tenderness.  Musculoskeletal:  Cervical back: Neck supple.     Right lower leg: No edema.     Left lower leg: No edema.  Lymphadenopathy:     Cervical: No cervical adenopathy.  Skin:    General: Skin is warm and dry.  Neurological:     Mental Status: She is alert and oriented to person, place, and time.     Motor: No abnormal muscle tone.     Coordination: Coordination normal.  Psychiatric:        Mood and Affect: Mood normal.        Behavior: Behavior normal.     A/P:  1. Age-related osteoporosis without current pathological fracture - on prolia, next due in 09/2021 - last dexa in 01/2021 - VITAMIN D 25 Hydroxy (Vit-D Deficiency, Fractures)  2. Screening for lipid disorders - Lipid panel  3. Screening for diabetes mellitus (DM) - Comprehensive metabolic panel  4. Encounter for hepatitis C screening test for low risk patien - Hepatitis C Antibody  5. Need for pneumococcal vaccination - Pneumococcal conjugate vaccine 13-valent IM  6. Pain in lower jaw - symptoms x 8-46mo tender to touch and if she lays on Lt side - normal dental exam - DG Mandible 1-3 Views; Future    This visit occurred during the SARS-CoV-2 public health emergency.  Safety protocols were in place, including screening questions prior to the visit, additional usage of staff PPE, and extensive cleaning of exam room while observing appropriate contact time as indicated for disinfecting solutions.

## 2021-05-22 NOTE — Patient Instructions (Addendum)
Health Maintenance Due  Topic Date Due   Hepatitis C Screening  Never done   Zoster Vaccines- Shingrix (1 of 2) Will discuss during visit. Never done   PNA vac Low Risk Adult (1 of 2 - PCV13) Will discuss during visit. Never done   COVID-19 Vaccine (4 - Booster for [employer] series)Patient has received 3 of 4 vaccines. 01/20/2021    Depression screen PHQ 2/9 03/25/2021 12/18/2020 01/21/2017  Decreased Interest 0 0 0  Down, Depressed, Hopeless 0 0 0  PHQ - 2 Score 0 0 0  Altered sleeping - - 0  Tired, decreased energy - - 0  Change in appetite - - 0  Feeling bad or failure about yourself  - - 0  Trouble concentrating - - 0  Moving slowly or fidgety/restless - - 0  Suicidal thoughts - - 0  PHQ-9 Score - - 0

## 2021-05-23 LAB — HEPATITIS C ANTIBODY
Hepatitis C Ab: NONREACTIVE
SIGNAL TO CUT-OFF: 0 (ref <1.00)

## 2021-07-24 DIAGNOSIS — Z85828 Personal history of other malignant neoplasm of skin: Secondary | ICD-10-CM | POA: Diagnosis not present

## 2021-07-24 DIAGNOSIS — L578 Other skin changes due to chronic exposure to nonionizing radiation: Secondary | ICD-10-CM | POA: Diagnosis not present

## 2021-07-24 DIAGNOSIS — D2371 Other benign neoplasm of skin of right lower limb, including hip: Secondary | ICD-10-CM | POA: Diagnosis not present

## 2021-07-24 DIAGNOSIS — I788 Other diseases of capillaries: Secondary | ICD-10-CM | POA: Diagnosis not present

## 2021-07-24 DIAGNOSIS — D225 Melanocytic nevi of trunk: Secondary | ICD-10-CM | POA: Diagnosis not present

## 2021-07-24 DIAGNOSIS — X32XXXS Exposure to sunlight, sequela: Secondary | ICD-10-CM | POA: Diagnosis not present

## 2021-07-24 DIAGNOSIS — L814 Other melanin hyperpigmentation: Secondary | ICD-10-CM | POA: Diagnosis not present

## 2021-07-24 DIAGNOSIS — L2089 Other atopic dermatitis: Secondary | ICD-10-CM | POA: Diagnosis not present

## 2021-07-24 DIAGNOSIS — I781 Nevus, non-neoplastic: Secondary | ICD-10-CM | POA: Diagnosis not present

## 2021-08-29 ENCOUNTER — Inpatient Hospital Stay (HOSPITAL_BASED_OUTPATIENT_CLINIC_OR_DEPARTMENT_OTHER)
Admission: EM | Admit: 2021-08-29 | Discharge: 2021-09-02 | DRG: 386 | Disposition: A | Discharge status: Home / Self Care | Payer: PPO | Attending: Internal Medicine | Admitting: Internal Medicine

## 2021-08-29 ENCOUNTER — Encounter (HOSPITAL_BASED_OUTPATIENT_CLINIC_OR_DEPARTMENT_OTHER): Payer: Self-pay | Admitting: Emergency Medicine

## 2021-08-29 ENCOUNTER — Other Ambulatory Visit: Payer: Self-pay

## 2021-08-29 ENCOUNTER — Emergency Department (HOSPITAL_BASED_OUTPATIENT_CLINIC_OR_DEPARTMENT_OTHER): Payer: PPO

## 2021-08-29 DIAGNOSIS — Z20822 Contact with and (suspected) exposure to covid-19: Secondary | ICD-10-CM | POA: Diagnosis not present

## 2021-08-29 DIAGNOSIS — R109 Unspecified abdominal pain: Secondary | ICD-10-CM | POA: Diagnosis not present

## 2021-08-29 DIAGNOSIS — K5150 Left sided colitis without complications: Secondary | ICD-10-CM | POA: Diagnosis not present

## 2021-08-29 DIAGNOSIS — E876 Hypokalemia: Secondary | ICD-10-CM | POA: Diagnosis not present

## 2021-08-29 DIAGNOSIS — K219 Gastro-esophageal reflux disease without esophagitis: Secondary | ICD-10-CM | POA: Diagnosis not present

## 2021-08-29 DIAGNOSIS — K559 Vascular disorder of intestine, unspecified: Secondary | ICD-10-CM | POA: Diagnosis present

## 2021-08-29 DIAGNOSIS — M810 Age-related osteoporosis without current pathological fracture: Secondary | ICD-10-CM | POA: Diagnosis not present

## 2021-08-29 DIAGNOSIS — Z79899 Other long term (current) drug therapy: Secondary | ICD-10-CM | POA: Diagnosis not present

## 2021-08-29 DIAGNOSIS — K529 Noninfective gastroenteritis and colitis, unspecified: Secondary | ICD-10-CM | POA: Diagnosis not present

## 2021-08-29 DIAGNOSIS — R1110 Vomiting, unspecified: Secondary | ICD-10-CM | POA: Diagnosis not present

## 2021-08-29 LAB — CBC WITH DIFFERENTIAL/PLATELET
Abs Immature Granulocytes: 0.1 10*3/uL — ABNORMAL HIGH (ref 0.00–0.07)
Basophils Absolute: 0 10*3/uL (ref 0.0–0.1)
Basophils Relative: 0 %
Eosinophils Absolute: 0 10*3/uL (ref 0.0–0.5)
Eosinophils Relative: 0 %
HCT: 37.4 % (ref 36.0–46.0)
Hemoglobin: 12.3 g/dL (ref 12.0–15.0)
Immature Granulocytes: 1 %
Lymphocytes Relative: 4 %
Lymphs Abs: 0.8 10*3/uL (ref 0.7–4.0)
MCH: 30.8 pg (ref 26.0–34.0)
MCHC: 32.9 g/dL (ref 30.0–36.0)
MCV: 93.7 fL (ref 80.0–100.0)
Monocytes Absolute: 1.6 10*3/uL — ABNORMAL HIGH (ref 0.1–1.0)
Monocytes Relative: 8 %
Neutro Abs: 18.4 10*3/uL — ABNORMAL HIGH (ref 1.7–7.7)
Neutrophils Relative %: 87 %
Platelets: 261 10*3/uL (ref 150–400)
RBC: 3.99 MIL/uL (ref 3.87–5.11)
RDW: 13.2 % (ref 11.5–15.5)
WBC: 20.9 10*3/uL — ABNORMAL HIGH (ref 4.0–10.5)
nRBC: 0 % (ref 0.0–0.2)

## 2021-08-29 LAB — BASIC METABOLIC PANEL
Anion gap: 8 (ref 5–15)
BUN: 16 mg/dL (ref 8–23)
CO2: 24 mmol/L (ref 22–32)
Calcium: 8.6 mg/dL — ABNORMAL LOW (ref 8.9–10.3)
Chloride: 105 mmol/L (ref 98–111)
Creatinine, Ser: 0.73 mg/dL (ref 0.44–1.00)
GFR, Estimated: >60 mL/min (ref >60)
Glucose, Bld: 151 mg/dL — ABNORMAL HIGH (ref 70–99)
Potassium: 3.6 mmol/L (ref 3.5–5.1)
Sodium: 137 mmol/L (ref 135–145)

## 2021-08-29 MED ORDER — ONDANSETRON HCL 4 MG/2ML IJ SOLN: 4.0000 mg | Freq: Four times a day (QID) | INTRAMUSCULAR | Status: DC | PRN

## 2021-08-29 MED ORDER — IOHEXOL 350 MG/ML SOLN
100.0000 mL | Freq: Once | INTRAVENOUS | Status: AC | PRN
  Administered 2021-08-29: 85 mL via INTRAVENOUS

## 2021-08-29 MED ORDER — LACTATED RINGERS BOLUS PEDS: 1000.0000 mL | Freq: Once | INTRAVENOUS | Status: DC

## 2021-08-29 MED ORDER — MORPHINE SULFATE (PF) 2 MG/ML IV SOLN
0.5000 mg | INTRAVENOUS | Status: DC | PRN
Start: 2021-08-29 — End: 2021-09-02

## 2021-08-29 MED ORDER — METRONIDAZOLE 500 MG/100ML IV SOLN
500.0000 mg | Freq: Once | INTRAVENOUS | Status: AC
  Administered 2021-08-29: 500 mg via INTRAVENOUS
  Filled 2021-08-29: qty 100

## 2021-08-29 MED ORDER — METRONIDAZOLE 500 MG/100ML IV SOLN
500.0000 mg | Freq: Two times a day (BID) | INTRAVENOUS | Status: DC
  Administered 2021-08-29 – 2021-09-01 (×7): 500 mg via INTRAVENOUS
  Filled 2021-08-29 (×8): qty 100

## 2021-08-29 MED ORDER — LACTATED RINGERS IV BOLUS
1000.0000 mL | Freq: Once | INTRAVENOUS | Status: AC
  Administered 2021-08-29: 1000 mL via INTRAVENOUS

## 2021-08-29 MED ORDER — LEVOFLOXACIN IN D5W 750 MG/150ML IV SOLN
750.0000 mg | INTRAVENOUS | Status: DC
  Administered 2021-08-30 – 2021-09-02 (×4): 750 mg via INTRAVENOUS
  Filled 2021-08-29 (×4): qty 150

## 2021-08-29 MED ORDER — LEVOFLOXACIN IN D5W 750 MG/150ML IV SOLN
750.0000 mg | Freq: Once | INTRAVENOUS | Status: AC
  Administered 2021-08-29: 750 mg via INTRAVENOUS
  Filled 2021-08-29: qty 150

## 2021-08-29 MED ORDER — SODIUM CHLORIDE 0.9 % IV SOLN: INTRAVENOUS | Status: AC

## 2021-08-29 NOTE — ED Notes (Signed)
Presents with having N/V/D for the past 24 hours, does not tolerate POs very well, states last vomiting episode was last night.

## 2021-08-29 NOTE — ED Triage Notes (Signed)
States N/V/D since yesterday states has been traveling alot

## 2021-08-29 NOTE — Plan of Care (Signed)

## 2021-08-29 NOTE — ED Notes (Signed)
[facility]ed [facility] for consult to hospitalist

## 2021-08-29 NOTE — ED Notes (Signed)
Called [facility] for general surgery consult

## 2021-08-29 NOTE — ED Notes (Signed)
Pt transported to CT ?

## 2021-08-29 NOTE — ED Provider Notes (Signed)
[facility] Provider Note   CSN: [id number] Arrival date & time: 08/29/21  4627     History Chief Complaint  Patient presents with   Emesis    Virginia Fox is a 70 y.o. female since with abdominal pain, nausea, vomiting, and diarrhea for the past 24 hours.  Patient recently got back from a trip to [place].  Has had approximately 4 episodes of emesis, and numerous episodes of diarrhea.  Patient noted blood in her diarrhea last night, as well as this morning.  She has had poor p.o. intake. Patient states prior C. difficile infection after antibiotics approximately 6 years ago.  No recent antibiotic use.   Emesis Associated symptoms: abdominal pain, chills and diarrhea   Associated symptoms: no fever       Past Medical History:  Diagnosis Date   Allergy    Heart murmur    Osteoporosis     Patient Active Problem List   Diagnosis Date Noted   Ischemic colitis ([place]) 08/29/2021   Age-related osteoporosis without current pathological fracture 12/18/2020    Past Surgical History:  Procedure Laterality Date   AUGMENTATION MAMMAPLASTY Bilateral    BREAST ENHANCEMENT SURGERY     CESAREAN SECTION       OB History     Gravida  2   Para  2   Term  2   Preterm  0   AB  0   Living  2      SAB  0   IAB  0   Ectopic  0   Multiple  0   Live Births  2           Family History  Problem Relation Age of Onset   Diabetes Father    Hypertension Father    Multiple sclerosis Mother     Social History   Tobacco Use   Smoking status: Never    Passive exposure: Never   Smokeless tobacco: Never  Vaping Use   Vaping Use: Never used  Substance Use Topics   Alcohol use: Yes    Comment: socially   Drug use: No    Home Medications Prior to Admission medications   Medication Sig Start Date End Date Taking? Authorizing Provider  cetirizine (ZYRTEC) 10 MG tablet Take 10 mg by mouth daily.    [provider]   cholecalciferol (VITAMIN D) 400 units TABS tablet Take 400 Units by mouth.    [provider]  denosumab (PROLIA) 60 MG/ML SOLN injection Inject 60 mg into the skin every 6 (six) months. Administer in upper arm, thigh, or abdomen    [provider]  loratadine (CLARITIN) 10 MG tablet Take 10 mg by mouth daily. Patient not taking: Reported on 05/22/2021    [provider]  Multiple Vitamins-Minerals (MULTIVITAMIN WITH MINERALS) tablet Take 1 tablet by mouth daily.    [provider]    Allergies    Patient has no known allergies.  Review of Systems   Review of Systems  Constitutional:  Positive for chills. Negative for fever.  Gastrointestinal:  Positive for abdominal pain, blood in stool, diarrhea, nausea and vomiting.  Genitourinary:  Negative for dysuria, flank pain and frequency.  All other systems reviewed and are negative.  Physical Exam Updated Vital Signs BP (!) 130/52   Pulse 72   Temp 99 F (37.2 C) (Oral)   Resp 16   Ht 5' 5"  (1.651 m)   Wt 59 kg  SpO2 98%   BMI 21.63 kg/m   Physical Exam Vitals and nursing note reviewed.  Constitutional:      Appearance: Normal appearance.  HENT:     Head: Normocephalic and atraumatic.  Eyes:     Conjunctiva/sclera: Conjunctivae normal.  Cardiovascular:     Rate and Rhythm: Normal rate and regular rhythm.  Pulmonary:     Effort: Pulmonary effort is normal. No respiratory distress.     Breath sounds: Normal breath sounds.  Abdominal:     General: There is no distension.     Palpations: Abdomen is soft.     Tenderness: There is generalized abdominal tenderness. There is no rebound.  Skin:    General: Skin is warm and dry.  Neurological:     General: No focal deficit present.     Mental Status: She is alert.    ED Results / Procedures / Treatments   Labs (all labs ordered are listed, but only abnormal results are displayed) Labs Reviewed  CBC WITH DIFFERENTIAL/PLATELET - Abnormal;  Notable for the following components:      Result Value   WBC 20.9 (*)    Neutro Abs 18.4 (*)    Monocytes Absolute 1.6 (*)    Abs Immature Granulocytes 0.10 (*)    All other components within normal limits  BASIC METABOLIC PANEL - Abnormal; Notable for the following components:   Glucose, Bld 151 (*)    Calcium 8.6 (*)    All other components within normal limits  GASTROINTESTINAL PANEL BY PCR, STOOL (REPLACES STOOL CULTURE)  C DIFFICILE QUICK SCREEN W PCR REFLEX    SARS CORONAVIRUS 2 (TAT 6-24 HRS)    EKG None  Radiology CT ABDOMEN PELVIS W CONTRAST  Result Date: 08/29/2021 CLINICAL DATA:  Abdominal pain. Nausea-vomiting and diarrhea since yesterday. EXAM: CT ABDOMEN AND PELVIS WITH CONTRAST TECHNIQUE: Multidetector CT imaging of the abdomen and pelvis was performed using the standard protocol following bolus administration of intravenous contrast. CONTRAST:  60m OMNIPAQUE IOHEXOL 350 MG/ML SOLN COMPARISON:  None. FINDINGS: Lower chest: Clear lung bases. Normal heart size without pericardial or pleural effusion. Tiny hiatal hernia. Hepatobiliary: Normal liver. Normal gallbladder, without biliary ductal dilatation. Pancreas: Normal, without mass or ductal dilatation. Spleen: Normal in size, without focal abnormality. Adrenals/Urinary Tract: Normal adrenal glands. Interpolar left renal scarring with too small to characterize left renal lesions. Normal right kidney. No hydronephrosis. Stomach/Bowel: Normal remainder of the stomach. From the level of the splenic flexure on 12/02 through the proximal sigmoid on 55/2, there is moderate colonic wall thickening, pericolonic edema, and suggestion of hypoenhancement. Normal terminal ileum.  Normal small bowel. Vascular/Lymphatic: Aortic atherosclerosis. Patent mesenteric vessels. No abdominopelvic adenopathy. Reproductive: Normal uterus, without adnexal mass. Prominent gonadal veins, primarily on the left. Other: Trace cul-de-sac and right pelvic  fluid including on 56/3. No free intraperitoneal air. Musculoskeletal: No acute osseous abnormality. Convex right lumbar spine curvature is mild. IMPRESSION: 1. Left-sided colitis, with appearance and distribution which favor ischemia. Infectious and inflammatory etiologies felt less likely. 2. Small volume right pelvic and cul-de-sac fluid, likely secondary. 3.  Tiny hiatal hernia. 4.  Aortic Atherosclerosis (ICD10-I70.0). 5. 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 Electronically Signed   By: KAbigail MiyamotoM.D.   On: 08/29/2021 14:07    Procedures Procedures   Medications Ordered in ED Medications  levofloxacin (LEVAQUIN) IVPB 750 mg (has no administration in time range)  metroNIDAZOLE (FLAGYL) IVPB 500 mg (has no administration in time range)  lactated ringers bolus 1,000 mL (has no administration in time range)  lactated ringers bolus 1,000 mL (0 mLs Intravenous Stopped 08/29/21 1317)  iohexol (OMNIPAQUE) 350 MG/ML injection 100 mL (85 mLs Intravenous Contrast Given 08/29/21 1317)    ED Course  I have reviewed the triage vital signs and the nursing notes.  Pertinent labs & imaging results that were available during my care of the patient were reviewed by me and considered in my medical decision making (see chart for details).    MDM Rules/Calculators/A&P                           Patient is 70 year old female who presents with abdominal pain, nausea, vomiting, and bloody diarrhea since yesterday. Recently returned from traveling to [place]. On exam patient is afebrile, she has generalized tenderness to palpation of her abdomen. Has history of c-diff infection after antibiotics.  CBC shows leukocytosis of 20.9. Creatinine stable. Given LR bolus, on reevaluation patient has persistent abdominal tenderness, worse in LLQ. CT abdomen/pelvis showed left-sided colitis with appearance favoring ischemia. However clinically symptoms appear more consistent with  infectious etiology.   Consulted with Dr Zenia Resides with general surgery who evaluated patient's scan and recommended admission to medicine for observation with levofloxacin, metronidazole, and IVF. Surgery will evaluate her inpatient. Consulted hospitalist for admission, discussed with [facility]. Karleen [facility] at [facility] who accepted patient.  Discussed with and seen in conjunction with attending physician Dr Johnney Killian who agrees with above plan.   Final Clinical Impression(s) / ED Diagnoses Final diagnoses:  Ischemic colitis [facility])    Rx / DC Orders ED Discharge Orders     None        Estill Cotta 08/29/21 1614    Charlesetta Shanks, MD 08/30/21 [phone]

## 2021-08-29 NOTE — H&P (Addendum)
History and Physical    Virginia Fox JQG:[id number] DOB: Jul 23, 1951 DOA: 08/29/2021  PCP: Ronnald Nian, DO  Patient coming from: [facility]  I have personally briefly reviewed patient's old medical records in [place]  Chief Complaint: Nausea, vomiting and abdominal pain  HPI: Virginia Fox is a 70 y.o. female with medical history significant for osteoporosis on Prolia injection, GERD, history of remote C. difficile following antibiotic use who presents with abdominal pain, nausea vomiting and diarrhea.  Patient recently traveled to [place] and was on her flight back yesterday when she developed abdominal pain, nausea vomiting and diarrhea.  Pain is diffuse but worse on the left side.  Diarrhea later became more bloody.  As of today her nausea, vomiting and diarrhea have subsided but continues to have persistent abdominal pain.  Patient reports having more spicy food and more alcohol while on her vacation.  She has history of C-section x2 but no recent abdominal surgeries.  Denies any tobacco or illicit drug use.  ED Course: She had temp of 20F, was normotensive on room air.  WBC of 20.  Hemoglobin 12.3.  BMP otherwise unremarkable. CT of the abdomen pelvis showed left-sided colitis with distribution appearance favoring more ischemia.  ED physician at outside ED discussed with general surgeon Dr. Zenia Resides who recommended that she be started on IV antibiotics and will see in consultation.  Review of Systems: Constitutional: No Weight Change, No Fever ENT/Mouth: No sore throat, No Rhinorrhea Eyes: No Eye Pain, No Vision Changes Cardiovascular: No Chest Pain, no SOB Respiratory: No Cough, No Sputum Gastrointestinal: + Nausea, + Vomiting, + Diarrhea, No Constipation, + Pain Genitourinary: no Urinary Incontinence Musculoskeletal: No Arthralgias, No Myalgias Skin: No Skin Lesions, No Pruritus, Neuro: no Weakness, No Numbness Psych: No Anxiety/Panic, No  Depression, no decrease appetite Heme/Lymph: No Bruising, No Bleeding  Past Medical History:  Diagnosis Date   Allergy    Heart murmur    Osteoporosis     Past Surgical History:  Procedure Laterality Date   AUGMENTATION MAMMAPLASTY Bilateral    BREAST ENHANCEMENT SURGERY     CESAREAN SECTION       reports that she has never smoked. She has never been exposed to tobacco smoke. She has never used smokeless tobacco. She reports current alcohol use. She reports that she does not use drugs. Social History  No Known Allergies  Family History  Problem Relation Age of Onset   Diabetes Father    Hypertension Father    Multiple sclerosis Mother      Prior to Admission medications   Medication Sig Start Date End Date Taking? Authorizing Provider  cetirizine (ZYRTEC) 10 MG tablet Take 10 mg by mouth daily.    [provider]  cholecalciferol (VITAMIN D) 400 units TABS tablet Take 400 Units by mouth.    [provider]  denosumab (PROLIA) 60 MG/ML SOLN injection Inject 60 mg into the skin every 6 (six) months. Administer in upper arm, thigh, or abdomen    [provider]  loratadine (CLARITIN) 10 MG tablet Take 10 mg by mouth daily. Patient not taking: Reported on 05/22/2021    [provider]  Multiple Vitamins-Minerals (MULTIVITAMIN WITH MINERALS) tablet Take 1 tablet by mouth daily.    [provider]    Physical Exam: Vitals:   08/29/21 1246 08/29/21 1300 08/29/21 1626 08/29/21 1844  BP: (!) 140/54 (!) 130/52 (!) 133/53 (!) 142/57  Pulse: 77 72 81 76  Resp: 15  16  18  Temp:    98.7 F (37.1 C)  TempSrc:    Oral  SpO2: 98% 98% 94% 100%  Weight:      Height:        Constitutional: NAD, calm, comfortable, nontoxic well-appearing elderly female sitting upright in bed Vitals:   08/29/21 1246 08/29/21 1300 08/29/21 1626 08/29/21 1844  BP: (!) 140/54 (!) 130/52 (!) 133/53 (!) 142/57  Pulse: 77 72 81 76  Resp: 15 16  18   Temp:     98.7 F (37.1 C)  TempSrc:    Oral  SpO2: 98% 98% 94% 100%  Weight:      Height:       Eyes: PERRL, lids and conjunctivae normal ENMT: Mucous membranes are moist.  Neck: normal, supple Respiratory: clear to auscultation bilaterally, no wheezing, no crackles. Normal respiratory effort. No accessory muscle use.  Cardiovascular: Regular rate and rhythm, no murmurs / rubs / gallops. No extremity edema.  Abdomen: Mild abdominal distention with tenderness to palpation of the left lower quadrant worse near the left inguinal region with voluntary guarding but no rigidity or rebound tenderness.  No mass palpated.  Positive bowel sounds in all quadrants. musculoskeletal: no clubbing / cyanosis. No joint deformity upper and lower extremities. Good ROM, no contractures. Normal muscle tone.  Skin: no rashes, lesions, ulcers. No induration Neurologic: CN 2-12 grossly intact. Sensation intact,  Strength 5/5 in all 4.  Psychiatric: Normal judgment and insight. Alert and oriented x 3. Normal mood.     Labs on Admission: I have personally reviewed following labs and imaging studies  CBC: Recent Labs  Lab 08/29/21 1044  WBC 20.9*  NEUTROABS 18.4*  HGB 12.3  HCT 37.4  MCV 93.7  PLT 470   Basic Metabolic Panel: Recent Labs  Lab 08/29/21 1044  NA 137  K 3.6  CL 105  CO2 24  GLUCOSE 151*  BUN 16  CREATININE 0.73  CALCIUM 8.6*   GFR: Estimated Creatinine Clearance: 57.2 mL/min (by C-G formula based on SCr of 0.73 mg/dL). Liver Function Tests: No results for input(s): AST, ALT, ALKPHOS, BILITOT, PROT, ALBUMIN in the last 168 hours. No results for input(s): LIPASE, AMYLASE in the last 168 hours. No results for input(s): AMMONIA in the last 168 hours. Coagulation Profile: No results for input(s): INR, PROTIME in the last 168 hours. Cardiac Enzymes: No results for input(s): CKTOTAL, CKMB, CKMBINDEX, TROPONINI in the last 168 hours. BNP (last 3 results) No results for input(s): PROBNP in  the last 8760 hours. HbA1C: No results for input(s): HGBA1C in the last 72 hours. CBG: No results for input(s): GLUCAP in the last 168 hours. Lipid Profile: No results for input(s): CHOL, HDL, LDLCALC, TRIG, CHOLHDL, LDLDIRECT in the last 72 hours. Thyroid Function Tests: No results for input(s): TSH, T4TOTAL, FREET4, T3FREE, THYROIDAB in the last 72 hours. Anemia Panel: No results for input(s): VITAMINB12, FOLATE, FERRITIN, TIBC, IRON, RETICCTPCT in the last 72 hours. Urine analysis: No results found for: COLORURINE, APPEARANCEUR, LABSPEC, PHURINE, GLUCOSEU, HGBUR, BILIRUBINUR, KETONESUR, PROTEINUR, UROBILINOGEN, NITRITE, LEUKOCYTESUR  Radiological Exams on Admission: CT ABDOMEN PELVIS W CONTRAST  Result Date: 08/29/2021 CLINICAL DATA:  Abdominal pain. Nausea-vomiting and diarrhea since yesterday. EXAM: CT ABDOMEN AND PELVIS WITH CONTRAST TECHNIQUE: Multidetector CT imaging of the abdomen and pelvis was performed using the standard protocol following bolus administration of intravenous contrast. CONTRAST:  42m OMNIPAQUE IOHEXOL 350 MG/ML SOLN COMPARISON:  None. FINDINGS: Lower chest: Clear lung bases. Normal heart size without pericardial  or pleural effusion. Tiny hiatal hernia. Hepatobiliary: Normal liver. Normal gallbladder, without biliary ductal dilatation. Pancreas: Normal, without mass or ductal dilatation. Spleen: Normal in size, without focal abnormality. Adrenals/Urinary Tract: Normal adrenal glands. Interpolar left renal scarring with too small to characterize left renal lesions. Normal right kidney. No hydronephrosis. Stomach/Bowel: Normal remainder of the stomach. From the level of the splenic flexure on 12/02 through the proximal sigmoid on 55/2, there is moderate colonic wall thickening, pericolonic edema, and suggestion of hypoenhancement. Normal terminal ileum.  Normal small bowel. Vascular/Lymphatic: Aortic atherosclerosis. Patent mesenteric vessels. No abdominopelvic adenopathy.  Reproductive: Normal uterus, without adnexal mass. Prominent gonadal veins, primarily on the left. Other: Trace cul-de-sac and right pelvic fluid including on 56/3. No free intraperitoneal air. Musculoskeletal: No acute osseous abnormality. Convex right lumbar spine curvature is mild. IMPRESSION: 1. Left-sided colitis, with appearance and distribution which favor ischemia. Infectious and inflammatory etiologies felt less likely. 2. Small volume right pelvic and cul-de-sac fluid, likely secondary. 3.  Tiny hiatal hernia. 4.  Aortic Atherosclerosis (ICD10-I70.0). 5. 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 Electronically Signed   By: Abigail Miyamoto M.D.   On: 08/29/2021 14:07      Assessment/Plan  Left-sided ischemic colitis -Patient overall well-appearing on exam.  Has abdomen distention with left-sided abdominal pain but no peritoneal signs -Continue bowel rest, IV fluids, IV Levaquin and Flagyl per surgery -I have discussed with general surgeon Dr. Marlou Starks who will see in consultation in the morning  Osteoporosis - Patient on Prolia injections  DVT prophylaxis:.SCDs Code Status: Full Family Communication: Plan discussed with patient at bedside  disposition Plan: Home with at least 2 midnight stays  Consults called: General surgery Admission status: inpatient  Level of care: Med-Surg  Status is: Inpatient  Remains inpatient appropriate because:Inpatient level of care appropriate due to severity of illness  Dispo: The patient is from: Home              Anticipated d/c is to: Home              Patient currently is not medically stable to d/c.   Difficult to place patient No         Virginia Desanctis DO [facility]   If 7PM-7AM, please contact night-coverage [email or web address]   08/29/2021, 8:09 PM

## 2021-08-29 NOTE — ED Notes (Signed)
Phone Hand Off Report given to [employer]

## 2021-08-29 NOTE — ED Notes (Signed)
ED PA in to speak with pt and daughter regards to results of CT scan and reason for Hospitalist consult

## 2021-08-29 NOTE — Progress Notes (Signed)
70 year old lady coming in to [facility] for abdominal pain, was found to have left sided colitis, possibly ischemic.  EDP discussed with General Surgery Dr Zenia Resides who will see the patient.  She will be admitted to [facility] .  Patient accepted to [facility] med surg bed for evaluation and management of ischemic colitis.   Hosie Poisson, MD

## 2021-08-29 NOTE — ED Notes (Signed)
[facility] at bedside 

## 2021-08-30 LAB — CBC
HCT: 34.1 % — ABNORMAL LOW (ref 36.0–46.0)
Hemoglobin: 10.9 g/dL — ABNORMAL LOW (ref 12.0–15.0)
MCH: 30.6 pg (ref 26.0–34.0)
MCHC: 32 g/dL (ref 30.0–36.0)
MCV: 95.8 fL (ref 80.0–100.0)
Platelets: 222 10*3/uL (ref 150–400)
RBC: 3.56 MIL/uL — ABNORMAL LOW (ref 3.87–5.11)
RDW: 13.3 % (ref 11.5–15.5)
WBC: 17.8 10*3/uL — ABNORMAL HIGH (ref 4.0–10.5)
nRBC: 0 % (ref 0.0–0.2)

## 2021-08-30 LAB — SARS CORONAVIRUS 2 (TAT 6-24 HRS): SARS Coronavirus 2: NEGATIVE

## 2021-08-30 MED ORDER — METOPROLOL TARTRATE 5 MG/5ML IV SOLN
5.0000 mg | Freq: Once | INTRAVENOUS | Status: AC
  Administered 2021-08-30: 5 mg via INTRAVENOUS
  Filled 2021-08-30: qty 5

## 2021-08-30 MED ORDER — ACETAMINOPHEN 325 MG PO TABS
650.0000 mg | ORAL_TABLET | Freq: Four times a day (QID) | ORAL | Status: DC | PRN
  Administered 2021-09-01: 650 mg via ORAL
  Filled 2021-08-30: qty 2

## 2021-08-30 MED ORDER — SODIUM CHLORIDE 0.9 % IV SOLN: INTRAVENOUS | Status: DC

## 2021-08-30 NOTE — Consult Note (Signed)
Reason for Consult:abd pain Referring Physician: Dr. [name] is an 70 y.o. female.  HPI: The patient is a 70 year old female who presents with abdominal pain that started yesterday.  She seems to be a little bit better since coming to the hospital.  She denies any fevers or chills.  She underwent a CT scan that showed thickening of the left colon but no evidence of perforation or abscess consistent with colitis of unknown origin  Past Medical History:  Diagnosis Date   Allergy    Heart murmur    Osteoporosis     Past Surgical History:  Procedure Laterality Date   AUGMENTATION MAMMAPLASTY Bilateral    BREAST ENHANCEMENT SURGERY     CESAREAN SECTION      Family History  Problem Relation Age of Onset   Diabetes Father    Hypertension Father    Multiple sclerosis Mother     Social History:  reports that she has never smoked. She has never been exposed to tobacco smoke. She has never used smokeless tobacco. She reports current alcohol use. She reports that she does not use drugs.  Allergies: No Known Allergies  Medications: I have reviewed the patient's current medications.  Results for orders placed or performed during the hospital encounter of 08/29/21 (from the past 48 hour(s))  CBC with Differential     Status: Abnormal   Collection Time: 08/29/21 10:44 AM  Result Value Ref Range   WBC 20.9 (H) 4.0 - 10.5 K/uL   RBC 3.99 3.87 - 5.11 MIL/uL   Hemoglobin 12.3 12.0 - 15.0 g/dL   HCT 37.4 36.0 - 46.0 %   MCV 93.7 80.0 - 100.0 fL   MCH 30.8 26.0 - 34.0 pg   MCHC 32.9 30.0 - 36.0 g/dL   RDW 13.2 11.5 - 15.5 %   Platelets 261 150 - 400 K/uL   nRBC 0.0 0.0 - 0.2 %   Neutrophils Relative % 87 %   Neutro Abs 18.4 (H) 1.7 - 7.7 K/uL   Lymphocytes Relative 4 %   Lymphs Abs 0.8 0.7 - 4.0 K/uL   Monocytes Relative 8 %   Monocytes Absolute 1.6 (H) 0.1 - 1.0 K/uL   Eosinophils Relative 0 %   Eosinophils Absolute 0.0 0.0 - 0.5 K/uL   Basophils Relative 0 %    Basophils Absolute 0.0 0.0 - 0.1 K/uL   Immature Granulocytes 1 %   Abs Immature Granulocytes 0.10 (H) 0.00 - 0.07 K/uL    Comment: Performed at [facility], [address] [place], [place] [address]  Basic metabolic panel     Status: Abnormal   Collection Time: 08/29/21 10:44 AM  Result Value Ref Range   Sodium 137 135 - 145 mmol/L   Potassium 3.6 3.5 - 5.1 mmol/L   Chloride 105 98 - 111 mmol/L   CO2 24 22 - 32 mmol/L   Glucose, Bld 151 (H) 70 - 99 mg/dL    Comment: Glucose reference range applies only to samples taken after fasting for at least 8 hours.   BUN 16 8 - 23 mg/dL   Creatinine, Ser 0.73 0.44 - 1.00 mg/dL   Calcium 8.6 (L) 8.9 - 10.3 mg/dL   GFR, Estimated >60 >60 mL/min    Comment: (NOTE) Calculated using the CKD-EPI Creatinine Equation (2021)    Anion gap 8 5 - 15    Comment: Performed at [facility], [address] [place], [place] [address]  CT ABDOMEN PELVIS W CONTRAST  Result Date: 08/29/2021 CLINICAL DATA:  Abdominal pain. Nausea-vomiting and diarrhea since yesterday. EXAM: CT ABDOMEN AND PELVIS WITH CONTRAST TECHNIQUE: Multidetector CT imaging of the abdomen and pelvis was performed using the standard protocol following bolus administration of intravenous contrast. CONTRAST:  47m OMNIPAQUE IOHEXOL 350 MG/ML SOLN COMPARISON:  None. FINDINGS: Lower chest: Clear lung bases. Normal heart size without pericardial or pleural effusion. Tiny hiatal hernia. Hepatobiliary: Normal liver. Normal gallbladder, without biliary ductal dilatation. Pancreas: Normal, without mass or ductal dilatation. Spleen: Normal in size, without focal abnormality. Adrenals/Urinary Tract: Normal adrenal glands. Interpolar left renal scarring with too small to characterize left renal lesions. Normal right kidney. No hydronephrosis. Stomach/Bowel: Normal remainder of the stomach. From the level of the splenic flexure on 12/02 through the proximal sigmoid on 55/2, there is  moderate colonic wall thickening, pericolonic edema, and suggestion of hypoenhancement. Normal terminal ileum.  Normal small bowel. Vascular/Lymphatic: Aortic atherosclerosis. Patent mesenteric vessels. No abdominopelvic adenopathy. Reproductive: Normal uterus, without adnexal mass. Prominent gonadal veins, primarily on the left. Other: Trace cul-de-sac and right pelvic fluid including on 56/3. No free intraperitoneal air. Musculoskeletal: No acute osseous abnormality. Convex right lumbar spine curvature is mild. IMPRESSION: 1. Left-sided colitis, with appearance and distribution which favor ischemia. Infectious and inflammatory etiologies felt less likely. 2. Small volume right pelvic and cul-de-sac fluid, likely secondary. 3.  Tiny hiatal hernia. 4.  Aortic Atherosclerosis (ICD10-I70.0). 5. 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 Electronically Signed   By: KAbigail MiyamotoM.D.   On: 08/29/2021 14:07    Review of Systems  Constitutional: Negative.   HENT: Negative.    Eyes: Negative.   Respiratory: Negative.    Cardiovascular: Negative.   Gastrointestinal:  Positive for abdominal pain and diarrhea.  Endocrine: Negative.   Genitourinary: Negative.   Musculoskeletal: Negative.   Skin: Negative.   Allergic/Immunologic: Negative.   Neurological: Negative.   Hematological: Negative.   Psychiatric/Behavioral: Negative.    Blood pressure (!) 144/50, pulse 82, temperature 98.7 F (37.1 C), temperature source Oral, resp. rate 18, height 5' 5"  (1.651 m), weight 59 kg, SpO2 100 %. Physical Exam Constitutional:      General: She is not in acute distress.    Appearance: Normal appearance. She is normal weight.  HENT:     Head: Normocephalic and atraumatic.     Right Ear: External ear normal.     Left Ear: External ear normal.     Nose: Nose normal.     Mouth/Throat:     Mouth: Mucous membranes are moist.     Pharynx: Oropharynx is clear.  Eyes:     Extraocular  Movements: Extraocular movements intact.     Conjunctiva/sclera: Conjunctivae normal.     Pupils: Pupils are equal, round, and reactive to light.  Cardiovascular:     Rate and Rhythm: Normal rate and regular rhythm.     Pulses: Normal pulses.     Heart sounds: Normal heart sounds.  Abdominal:     General: Abdomen is flat.     Palpations: Abdomen is soft.     Comments: There is moderate focal LLQ tenderness. The rest of the abdomen is soft. No distension  Musculoskeletal:        General: No swelling or deformity. Normal range of motion.     Cervical back: Normal range of motion and neck supple.  Skin:    General: Skin is warm and dry.  Findings: No rash.  Neurological:     General: No focal deficit present.     Mental Status: She is alert and oriented to person, place, and time.  Psychiatric:        Mood and Affect: Mood normal.        Behavior: Behavior normal.    Assessment/Plan: The patient appears to have a colitis involving the left colon.  The etiology of this at this point is unclear but would include ischemic colitis versus infectious colitis.  I would agree with admission to the hospital and bowel rest.  She should be covered with broad-spectrum antibiotic therapy.  We will follow closely with you.  She may also benefit from a gastroenterology consult  Virginia Fox III 08/30/2021, 12:00 AM

## 2021-08-30 NOTE — Progress Notes (Signed)
PROGRESS NOTE    Virginia Fox  AJG:[id number] DOB: 1951/08/23 DOA: 08/29/2021 PCP: Ronnald Nian, DO   Chief Complain: Nausea, vomiting, abdominal pain  Brief Narrative:  Patient is a 70 year old female with history of osteoporosis, GERD, C. difficile presented with abdominal, nausea, vomiting and diarrhea.  Patient recently had travel to [place].  Diarrhea was bloody.  On presentation, she was mildly febrile, normotensive.  Lab work showed leukocytosis.  CT abdomen/pelvis showed left-sided colitis with distribution evidence favoring more ischemia.  General surgery also consulted and following.  On IV antibiotics.  Assessment & Plan:   Principal Problem:   Ischemic colitis ([place]) Active Problems:   Age-related osteoporosis without current pathological fracture    Left-sided ischemic colitis: Presented with abdominal pain, nausea, vomiting, diarrhea.  Diarrhea was bloody.  Had abdominal distention.  CT abdomen/pelvis showed left-sided colitis.  Continue bowel rest, IV fluids, started on IV Levaquin and Flagyl.  General surgery following. Declines worsening abdominal pain or diarrhea today.  C. difficile and GI pathogen panel sent but she does not have a bowel movement after admission  Leukocytosis: Continue to monitor, improving.  Osteoporosis: On Prolia injections          DVT prophylaxis:SCD Code Status: Full Family Communication: Family member at bedside Status is: Inpatient  Remains inpatient appropriate because:Inpatient level of care appropriate due to severity of illness  Dispo: The patient is from: Home              Anticipated d/c is to: Home              Patient currently is not medically stable to d/c.   Difficult to place patient No      Consultants: General surgery  Procedures:None  Antimicrobials:  Anti-infectives (From admission, onward)    Start     Dose/Rate Route Frequency Ordered Stop   08/30/21 0600  levofloxacin (LEVAQUIN) IVPB  750 mg        750 mg 100 mL/hr over 90 Minutes Intravenous Every 24 hours 08/29/21 1850     08/29/21 2000  metroNIDAZOLE (FLAGYL) IVPB 500 mg        500 mg 100 mL/hr over 60 Minutes Intravenous Every 12 hours 08/29/21 1850     08/29/21 1545  levofloxacin (LEVAQUIN) IVPB 750 mg        750 mg 100 mL/hr over 90 Minutes Intravenous  Once 08/29/21 1536 08/29/21 1748   08/29/21 1545  metroNIDAZOLE (FLAGYL) IVPB 500 mg        500 mg 100 mL/hr over 60 Minutes Intravenous  Once 08/29/21 1536 08/29/21 1717       Subjective:  Patient seen and examined the bedside this morning.  Hemodynamically stable.  Declines any worsening abdominal pain or nausea or vomiting.  No diarrhea.  Overall comfortable Objective: Vitals:   08/29/21 1626 08/29/21 1844 08/29/21 2055 08/30/21 0601  BP: (!) 133/53 (!) 142/57 (!) 144/50 (!) 134/56  Pulse: 81 76 82 88  Resp:  18 18 18   Temp:  98.7 F (37.1 C) 98.7 F (37.1 C) 98.8 F (37.1 C)  TempSrc:  Oral Oral Oral  SpO2: 94% 100% 100% 100%  Weight:      Height:        Intake/Output Summary (Last 24 hours) at 08/30/2021 0758 Last data filed at 08/30/2021 0600 Gross per 24 hour  Intake 1100 ml  Output 1200 ml  Net -100 ml   Filed Weights   08/29/21 1002  Weight: 59 kg  Examination:  General exam: Overall comfortable, not in distress HEENT: PERRL Respiratory system:  no wheezes or crackles  Cardiovascular system: S1 & S2 heard, RRR.  Gastrointestinal system: Abdomen is mildly distended, soft and nontender.Slow BS Central nervous system: Alert and oriented Extremities: No edema, no clubbing ,no cyanosis Skin: No rashes, no ulcers,no icterus      Data Reviewed: I have personally reviewed following labs and imaging studies  CBC: Recent Labs  Lab 08/29/21 1044 08/30/21 0422  WBC 20.9* 17.8*  NEUTROABS 18.4*  --   HGB 12.3 10.9*  HCT 37.4 34.1*  MCV 93.7 95.8  PLT 261 355   Basic Metabolic Panel: Recent Labs  Lab 08/29/21 1044  NA  137  K 3.6  CL 105  CO2 24  GLUCOSE 151*  BUN 16  CREATININE 0.73  CALCIUM 8.6*   GFR: Estimated Creatinine Clearance: 57.2 mL/min (by C-G formula based on SCr of 0.73 mg/dL). Liver Function Tests: No results for input(s): AST, ALT, ALKPHOS, BILITOT, PROT, ALBUMIN in the last 168 hours. No results for input(s): LIPASE, AMYLASE in the last 168 hours. No results for input(s): AMMONIA in the last 168 hours. Coagulation Profile: No results for input(s): INR, PROTIME in the last 168 hours. Cardiac Enzymes: No results for input(s): CKTOTAL, CKMB, CKMBINDEX, TROPONINI in the last 168 hours. BNP (last 3 results) No results for input(s): PROBNP in the last 8760 hours. HbA1C: No results for input(s): HGBA1C in the last 72 hours. CBG: No results for input(s): GLUCAP in the last 168 hours. Lipid Profile: No results for input(s): CHOL, HDL, LDLCALC, TRIG, CHOLHDL, LDLDIRECT in the last 72 hours. Thyroid Function Tests: No results for input(s): TSH, T4TOTAL, FREET4, T3FREE, THYROIDAB in the last 72 hours. Anemia Panel: No results for input(s): VITAMINB12, FOLATE, FERRITIN, TIBC, IRON, RETICCTPCT in the last 72 hours. Sepsis Labs: No results for input(s): PROCALCITON, LATICACIDVEN in the last 168 hours.  Recent Results (from the past 240 hour(s))  SARS CORONAVIRUS 2 (TAT 6-24 HRS) Nasopharyngeal Nasopharyngeal Swab     Status: None   Collection Time: 08/29/21  4:24 PM   Specimen: Nasopharyngeal Swab  Result Value Ref Range Status   SARS Coronavirus 2 NEGATIVE NEGATIVE Final    Comment: (NOTE) SARS-CoV-2 target nucleic acids are NOT DETECTED.  The SARS-CoV-2 RNA is generally detectable in upper and lower respiratory specimens during the acute phase of infection. Negative results do not preclude SARS-CoV-2 infection, do not rule out co-infections with other pathogens, and should not be used as the sole basis for treatment or other patient management decisions. Negative results must be  combined with clinical observations, patient history, and epidemiological information. The expected result is Negative.  Fact Sheet for Patients: SugarRoll.be  Fact Sheet for Healthcare Providers: https://www.woods-mathews.com/  This test is not yet approved or cleared by the [place] FDA and  has been authorized for detection and/or diagnosis of SARS-CoV-2 by FDA under an Emergency Use Authorization (EUA). This EUA will remain  in effect (meaning this test can be used) for the duration of the COVID-19 declaration under Se ction 564(b)(1) of the Act, 21 U.S.C. section 360bbb-3(b)(1), unless the authorization is terminated or revoked sooner.  Performed at [facility], [place] [address], [place], [place] [address]          Radiology Studies: CT ABDOMEN PELVIS W CONTRAST  Result Date: 08/29/2021 CLINICAL DATA:  Abdominal pain. Nausea-vomiting and diarrhea since yesterday. EXAM: CT ABDOMEN AND PELVIS WITH CONTRAST TECHNIQUE: Multidetector CT imaging  of the abdomen and pelvis was performed using the standard protocol following bolus administration of intravenous contrast. CONTRAST:  58m OMNIPAQUE IOHEXOL 350 MG/ML SOLN COMPARISON:  None. FINDINGS: Lower chest: Clear lung bases. Normal heart size without pericardial or pleural effusion. Tiny hiatal hernia. Hepatobiliary: Normal liver. Normal gallbladder, without biliary ductal dilatation. Pancreas: Normal, without mass or ductal dilatation. Spleen: Normal in size, without focal abnormality. Adrenals/Urinary Tract: Normal adrenal glands. Interpolar left renal scarring with too small to characterize left renal lesions. Normal right kidney. No hydronephrosis. Stomach/Bowel: Normal remainder of the stomach. From the level of the splenic flexure on 12/02 through the proximal sigmoid on 55/2, there is moderate colonic wall thickening, pericolonic edema, and suggestion of hypoenhancement. Normal  terminal ileum.  Normal small bowel. Vascular/Lymphatic: Aortic atherosclerosis. Patent mesenteric vessels. No abdominopelvic adenopathy. Reproductive: Normal uterus, without adnexal mass. Prominent gonadal veins, primarily on the left. Other: Trace cul-de-sac and right pelvic fluid including on 56/3. No free intraperitoneal air. Musculoskeletal: No acute osseous abnormality. Convex right lumbar spine curvature is mild. IMPRESSION: 1. Left-sided colitis, with appearance and distribution which favor ischemia. Infectious and inflammatory etiologies felt less likely. 2. Small volume right pelvic and cul-de-sac fluid, likely secondary. 3.  Tiny hiatal hernia. 4.  Aortic Atherosclerosis (ICD10-I70.0). 5. 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 Electronically Signed   By: KAbigail MiyamotoM.D.   On: 08/29/2021 14:07        Scheduled Meds: Continuous Infusions:  sodium chloride 75 mL/hr at 08/29/21 2153   levofloxacin (LEVAQUIN) IV 750 mg (08/30/21 0419)   metronidazole 500 mg (08/29/21 2159)     LOS: 1 day    Time spent: 25 mins.More than 50% of that time was spent in counseling and/or coordination of care.      AShelly Coss MD [facility] P9/24/2022, 7:58 AM

## 2021-08-30 NOTE — Progress Notes (Signed)
Subjective/Chief Complaint: Complains of LLQ pain. Slowly improving   Objective: Vital signs in last 24 hours: Temp:  [98.7 F (37.1 C)-99 F (37.2 C)] 98.7 F (37.1 C) (09/24 0934) Pulse Rate:  [72-88] 88 (09/24 0934) Resp:  [15-18] 18 (09/24 0934) BP: (128-151)/(48-60) 128/56 (09/24 0934) SpO2:  [94 %-100 %] 97 % (09/24 0934) Weight:  [59 kg] 59 kg (09/23 1002) Last BM Date: 08/29/21  Intake/Output from previous day: 09/23 0701 - 09/24 0700 In: 1100 [IV Piggyback:1100] Out: 1200 [Urine:1200] Intake/Output this shift: No intake/output data recorded.  General appearance: alert and cooperative Resp: clear to auscultation bilaterally Cardio: regular rate and rhythm GI: soft, flat, focally tender LLQ  Lab Results:  Recent Labs    08/29/21 1044 08/30/21 0422  WBC 20.9* 17.8*  HGB 12.3 10.9*  HCT 37.4 34.1*  PLT 261 222   BMET Recent Labs    08/29/21 1044  NA 137  K 3.6  CL 105  CO2 24  GLUCOSE 151*  BUN 16  CREATININE 0.73  CALCIUM 8.6*   PT/INR No results for input(s): LABPROT, INR in the last 72 hours. ABG No results for input(s): PHART, HCO3 in the last 72 hours.  Invalid input(s): PCO2, PO2  Studies/Results: CT ABDOMEN PELVIS W CONTRAST  Result Date: 08/29/2021 CLINICAL DATA:  Abdominal pain. Nausea-vomiting and diarrhea since yesterday. EXAM: CT ABDOMEN AND PELVIS WITH CONTRAST TECHNIQUE: Multidetector CT imaging of the abdomen and pelvis was performed using the standard protocol following bolus administration of intravenous contrast. CONTRAST:  70m OMNIPAQUE IOHEXOL 350 MG/ML SOLN COMPARISON:  None. FINDINGS: Lower chest: Clear lung bases. Normal heart size without pericardial or pleural effusion. Tiny hiatal hernia. Hepatobiliary: Normal liver. Normal gallbladder, without biliary ductal dilatation. Pancreas: Normal, without mass or ductal dilatation. Spleen: Normal in size, without focal abnormality. Adrenals/Urinary Tract: Normal adrenal  glands. Interpolar left renal scarring with too small to characterize left renal lesions. Normal right kidney. No hydronephrosis. Stomach/Bowel: Normal remainder of the stomach. From the level of the splenic flexure on 12/02 through the proximal sigmoid on 55/2, there is moderate colonic wall thickening, pericolonic edema, and suggestion of hypoenhancement. Normal terminal ileum.  Normal small bowel. Vascular/Lymphatic: Aortic atherosclerosis. Patent mesenteric vessels. No abdominopelvic adenopathy. Reproductive: Normal uterus, without adnexal mass. Prominent gonadal veins, primarily on the left. Other: Trace cul-de-sac and right pelvic fluid including on 56/3. No free intraperitoneal air. Musculoskeletal: No acute osseous abnormality. Convex right lumbar spine curvature is mild. IMPRESSION: 1. Left-sided colitis, with appearance and distribution which favor ischemia. Infectious and inflammatory etiologies felt less likely. 2. Small volume right pelvic and cul-de-sac fluid, likely secondary. 3.  Tiny hiatal hernia. 4.  Aortic Atherosclerosis (ICD10-I70.0). 5. 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 Electronically Signed   By: KAbigail MiyamotoM.D.   On: 08/29/2021 14:07    Anti-infectives: Anti-infectives (From admission, onward)    Start     Dose/Rate Route Frequency Ordered Stop   08/30/21 0600  levofloxacin (LEVAQUIN) IVPB 750 mg        750 mg 100 mL/hr over 90 Minutes Intravenous Every 24 hours 08/29/21 1850     08/29/21 2000  metroNIDAZOLE (FLAGYL) IVPB 500 mg        500 mg 100 mL/hr over 60 Minutes Intravenous Every 12 hours 08/29/21 1850     08/29/21 1545  levofloxacin (LEVAQUIN) IVPB 750 mg        750 mg 100 mL/hr over 90 Minutes Intravenous  Once 08/29/21  1536 08/29/21 1748   08/29/21 1545  metroNIDAZOLE (FLAGYL) IVPB 500 mg        500 mg 100 mL/hr over 60 Minutes Intravenous  Once 08/29/21 1536 08/29/21 1717       Assessment/Plan: s/p * No surgery found  * Advance diet. Allow only ice chips today Continue levaquin and flagyl for left sided colitis Ischemic vs infectious colitis. Does not seem to confined to a "watershed" area and she also reports one of her friends that she ate with the day before also got sick ambulate  LOS: 1 day    Autumn Messing III 08/30/2021

## 2021-08-31 LAB — COMPREHENSIVE METABOLIC PANEL
ALT: 20 U/L (ref 0–44)
AST: 17 U/L (ref 15–41)
Albumin: 3 g/dL — ABNORMAL LOW (ref 3.5–5.0)
Alkaline Phosphatase: 59 U/L (ref 38–126)
Anion gap: 8 (ref 5–15)
BUN: 12 mg/dL (ref 8–23)
CO2: 21 mmol/L — ABNORMAL LOW (ref 22–32)
Calcium: 7.9 mg/dL — ABNORMAL LOW (ref 8.9–10.3)
Chloride: 108 mmol/L (ref 98–111)
Creatinine, Ser: 0.5 mg/dL (ref 0.44–1.00)
GFR, Estimated: >60 mL/min (ref >60)
Glucose, Bld: 70 mg/dL (ref 70–99)
Potassium: 3.4 mmol/L — ABNORMAL LOW (ref 3.5–5.1)
Sodium: 137 mmol/L (ref 135–145)
Total Bilirubin: 0.9 mg/dL (ref 0.3–1.2)
Total Protein: 5.4 g/dL — ABNORMAL LOW (ref 6.5–8.1)

## 2021-08-31 LAB — GASTROINTESTINAL PANEL BY PCR, STOOL (REPLACES STOOL CULTURE)

## 2021-08-31 LAB — CBC WITH DIFFERENTIAL/PLATELET
Abs Immature Granulocytes: 0.07 10*3/uL (ref 0.00–0.07)
Basophils Absolute: 0.1 10*3/uL (ref 0.0–0.1)
Basophils Relative: 1 %
Eosinophils Absolute: 0.1 10*3/uL (ref 0.0–0.5)
Eosinophils Relative: 1 %
HCT: 33.9 % — ABNORMAL LOW (ref 36.0–46.0)
Hemoglobin: 10.6 g/dL — ABNORMAL LOW (ref 12.0–15.0)
Immature Granulocytes: 1 %
Lymphocytes Relative: 18 %
Lymphs Abs: 2.2 10*3/uL (ref 0.7–4.0)
MCH: 30.5 pg (ref 26.0–34.0)
MCHC: 31.3 g/dL (ref 30.0–36.0)
MCV: 97.7 fL (ref 80.0–100.0)
Monocytes Absolute: 1.2 10*3/uL — ABNORMAL HIGH (ref 0.1–1.0)
Monocytes Relative: 10 %
Neutro Abs: 8.6 10*3/uL — ABNORMAL HIGH (ref 1.7–7.7)
Neutrophils Relative %: 69 %
Platelets: 198 10*3/uL (ref 150–400)
RBC: 3.47 MIL/uL — ABNORMAL LOW (ref 3.87–5.11)
RDW: 13.2 % (ref 11.5–15.5)
WBC: 12.3 10*3/uL — ABNORMAL HIGH (ref 4.0–10.5)
nRBC: 0 % (ref 0.0–0.2)

## 2021-08-31 NOTE — Progress Notes (Signed)
PROGRESS NOTE    Virginia Fox  JQZ:[id number] DOB: [birth year] DOA: 08/29/2021 PCP: Ronnald Nian, DO   Chief Complain: Nausea, vomiting, abdominal pain  Brief Narrative:  Patient is a 70 year old female with history of osteoporosis, GERD, C. difficile presented with abdominal, nausea, vomiting and diarrhea.  Patient recently had travel to [place].  Diarrhea was bloody.  On presentation, she was mildly febrile, normotensive.  Lab work showed leukocytosis.  CT abdomen/pelvis showed left-sided colitis with distribution evidence favoring more ischemia.  General surgery also consulted and following.  On IV antibiotics.  Assessment & Plan:   Principal Problem:   Ischemic colitis ([place]) Active Problems:   Age-related osteoporosis without current pathological fracture    Left-sided ischemic colitis: Presented with abdominal pain, nausea, vomiting, diarrhea.  Diarrhea was bloody.  Had abdominal distention.  CT abdomen/pelvis showed left-sided colitis.  Continue bowel rest, IV fluids, started on IV Levaquin and Flagyl.  General surgery following. Felling better now.  Still having intermittent bloody bowel movement.  GI pathogen panel has been sent.  We will follow.  She needs to have a follow-up colonoscopy in 6 to 8 weeks to rule out inflammatory bowel disease. Started on clear liquid diet.  Leukocytosis: Continue to monitor, improving.  Osteoporosis: On Prolia injections          DVT prophylaxis:SCD Code Status: Full Family Communication:None at bedside Status is: Inpatient  Remains inpatient appropriate because:Inpatient level of care appropriate due to severity of illness  Dispo: The patient is from: Home              Anticipated d/c is to: Home              Patient currently is not medically stable to d/c.   Difficult to place patient No      Consultants: General surgery  Procedures:None  Antimicrobials:  Anti-infectives (From admission, onward)    Start      Dose/Rate Route Frequency Ordered Stop   08/30/21 0600  levofloxacin (LEVAQUIN) IVPB 750 mg        750 mg 100 mL/hr over 90 Minutes Intravenous Every 24 hours 08/29/21 1850     08/29/21 2000  metroNIDAZOLE (FLAGYL) IVPB 500 mg        500 mg 100 mL/hr over 60 Minutes Intravenous Every 12 hours 08/29/21 1850     08/29/21 1545  levofloxacin (LEVAQUIN) IVPB 750 mg        750 mg 100 mL/hr over 90 Minutes Intravenous  Once 08/29/21 1536 08/29/21 1748   08/29/21 1545  metroNIDAZOLE (FLAGYL) IVPB 500 mg        500 mg 100 mL/hr over 60 Minutes Intravenous  Once 08/29/21 1536 08/29/21 1717       Subjective:  Patient seen and examined at bedside this morning.  Feels better today.  Still having some abdominal discomfort but slowly improving.  Had a small bowel movement with blood this morning.No nausea and vomiting   Objective: Vitals:   08/30/21 2206 08/30/21 2352 08/31/21 0340 08/31/21 0513  BP: 116/80 125/74 (!) 110/46 (!) 137/56  Pulse: (!) 120 65 79 76  Resp: 18 12 16    Temp: 98.4 F (36.9 C) 98.8 F (37.1 C) 98.1 F (36.7 C)   TempSrc: Oral Oral Oral   SpO2: 97% 98% 98%   Weight:      Height:        Intake/Output Summary (Last 24 hours) at 08/31/2021 0741 Last data filed at 08/31/2021 0600 Gross per 24  hour  Intake 1857.03 ml  Output 1250 ml  Net 607.03 ml   Filed Weights   08/29/21 1002  Weight: 59 kg    Examination:  General exam: Overall comfortable, not in distress HEENT: PERRL Respiratory system:  no wheezes or crackles  Cardiovascular system: S1 & S2 heard, RRR.  Gastrointestinal system: Abdomen is nondistended, soft and nontender. Central nervous system: Alert and oriented Extremities: No edema, no clubbing ,no cyanosis Skin: No rashes, no ulcers,no icterus     Data Reviewed: I have personally reviewed following labs and imaging studies  CBC: Recent Labs  Lab 08/29/21 1044 08/30/21 0422 08/31/21 0409  WBC 20.9* 17.8* 12.3*  NEUTROABS 18.4*  --   8.6*  HGB 12.3 10.9* 10.6*  HCT 37.4 34.1* 33.9*  MCV 93.7 95.8 97.7  PLT 261 222 812   Basic Metabolic Panel: Recent Labs  Lab 08/29/21 1044 08/30/21 2345  NA 137 137  K 3.6 3.4*  CL 105 108  CO2 24 21*  GLUCOSE 151* 70  BUN 16 12  CREATININE 0.73 0.50  CALCIUM 8.6* 7.9*   GFR: Estimated Creatinine Clearance: 57.2 mL/min (by C-G formula based on SCr of 0.5 mg/dL). Liver Function Tests: Recent Labs  Lab 08/30/21 2345  AST 17  ALT 20  ALKPHOS 59  BILITOT 0.9  PROT 5.4*  ALBUMIN 3.0*   No results for input(s): LIPASE, AMYLASE in the last 168 hours. No results for input(s): AMMONIA in the last 168 hours. Coagulation Profile: No results for input(s): INR, PROTIME in the last 168 hours. Cardiac Enzymes: No results for input(s): CKTOTAL, CKMB, CKMBINDEX, TROPONINI in the last 168 hours. BNP (last 3 results) No results for input(s): PROBNP in the last 8760 hours. HbA1C: No results for input(s): HGBA1C in the last 72 hours. CBG: No results for input(s): GLUCAP in the last 168 hours. Lipid Profile: No results for input(s): CHOL, HDL, LDLCALC, TRIG, CHOLHDL, LDLDIRECT in the last 72 hours. Thyroid Function Tests: No results for input(s): TSH, T4TOTAL, FREET4, T3FREE, THYROIDAB in the last 72 hours. Anemia Panel: No results for input(s): VITAMINB12, FOLATE, FERRITIN, TIBC, IRON, RETICCTPCT in the last 72 hours. Sepsis Labs: No results for input(s): PROCALCITON, LATICACIDVEN in the last 168 hours.  Recent Results (from the past 240 hour(s))  SARS CORONAVIRUS 2 (TAT 6-24 HRS) Nasopharyngeal Nasopharyngeal Swab     Status: None   Collection Time: 08/29/21  4:24 PM   Specimen: Nasopharyngeal Swab  Result Value Ref Range Status   SARS Coronavirus 2 NEGATIVE NEGATIVE Final    Comment: (NOTE) SARS-CoV-2 target nucleic acids are NOT DETECTED.  The SARS-CoV-2 RNA is generally detectable in upper and lower respiratory specimens during the acute phase of infection.  Negative results do not preclude SARS-CoV-2 infection, do not rule out co-infections with other pathogens, and should not be used as the sole basis for treatment or other patient management decisions. Negative results must be combined with clinical observations, patient history, and epidemiological information. The expected result is Negative.  Fact Sheet for Patients: SugarRoll.be  Fact Sheet for Healthcare Providers: https://www.woods-mathews.com/  This test is not yet approved or cleared by the [place] FDA and  has been authorized for detection and/or diagnosis of SARS-CoV-2 by FDA under an Emergency Use Authorization (EUA). This EUA will remain  in effect (meaning this test can be used) for the duration of the COVID-19 declaration under Se ction 564(b)(1) of the Act, 21 U.S.C. section 360bbb-3(b)(1), unless the authorization is terminated or revoked sooner.  Performed at [facility], [place] [address], [place], [place] [address]          Radiology Studies: CT ABDOMEN PELVIS W CONTRAST  Result Date: 08/29/2021 CLINICAL DATA:  Abdominal pain. Nausea-vomiting and diarrhea since yesterday. EXAM: CT ABDOMEN AND PELVIS WITH CONTRAST TECHNIQUE: Multidetector CT imaging of the abdomen and pelvis was performed using the standard protocol following bolus administration of intravenous contrast. CONTRAST:  38m OMNIPAQUE IOHEXOL 350 MG/ML SOLN COMPARISON:  None. FINDINGS: Lower chest: Clear lung bases. Normal heart size without pericardial or pleural effusion. Tiny hiatal hernia. Hepatobiliary: Normal liver. Normal gallbladder, without biliary ductal dilatation. Pancreas: Normal, without mass or ductal dilatation. Spleen: Normal in size, without focal abnormality. Adrenals/Urinary Tract: Normal adrenal glands. Interpolar left renal scarring with too small to characterize left renal lesions. Normal right kidney. No hydronephrosis.  Stomach/Bowel: Normal remainder of the stomach. From the level of the splenic flexure on 12/02 through the proximal sigmoid on 55/2, there is moderate colonic wall thickening, pericolonic edema, and suggestion of hypoenhancement. Normal terminal ileum.  Normal small bowel. Vascular/Lymphatic: Aortic atherosclerosis. Patent mesenteric vessels. No abdominopelvic adenopathy. Reproductive: Normal uterus, without adnexal mass. Prominent gonadal veins, primarily on the left. Other: Trace cul-de-sac and right pelvic fluid including on 56/3. No free intraperitoneal air. Musculoskeletal: No acute osseous abnormality. Convex right lumbar spine curvature is mild. IMPRESSION: 1. Left-sided colitis, with appearance and distribution which favor ischemia. Infectious and inflammatory etiologies felt less likely. 2. Small volume right pelvic and cul-de-sac fluid, likely secondary. 3.  Tiny hiatal hernia. 4.  Aortic Atherosclerosis (ICD10-I70.0). 5. 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 Electronically Signed   By: KAbigail MiyamotoM.D.   On: 08/29/2021 14:07        Scheduled Meds: Continuous Infusions:  sodium chloride 75 mL/hr at 08/30/21 1345   levofloxacin (LEVAQUIN) IV 750 mg (08/31/21 0511)   metronidazole 500 mg (08/30/21 2018)     LOS: 2 days    Time spent: 25 mins.More than 50% of that time was spent in counseling and/or coordination of care.      AShelly Coss MD [facility] P9/25/2022, 7:41 AM

## 2021-08-31 NOTE — Progress Notes (Signed)
   08/30/21 2206  Assess: MEWS Score  Temp 98.4 F (36.9 C)  BP 116/80  Pulse Rate (!) 120  Resp 18  SpO2 97 %  O2 Device Room Air  Assess: MEWS Score  MEWS Temp 0  MEWS Systolic 0  MEWS Pulse 2  MEWS RR 0  MEWS LOC 0  MEWS Score 2  MEWS Score Color Yellow  Assess: if the MEWS score is Yellow or Red  Were vital signs taken at a resting state? Yes  Focused Assessment Change from prior assessment (see assessment flowsheet)  Does the patient meet 2 or more of the SIRS criteria? No  MEWS guidelines implemented *See Row Information* Yes  Treat  MEWS Interventions Administered prn meds/treatments  Pain Scale 0-10  Pain Score 0  Take Vital Signs  Increase Vital Sign Frequency  Yellow: Q 2hr X 2 then Q 4hr X 2, if remains yellow, continue Q 4hrs  Escalate  MEWS: Escalate Yellow: discuss with charge nurse/RN and consider discussing with provider and RRT  Notify: Charge Nurse/RN  Name of Charge Nurse/RN Notified Adrianna, RN  Date Charge Nurse/RN Notified 08/30/21  Time Charge Nurse/RN Notified 2213  Notify: Provider  Provider Name/Title [email or web address], NP  Date Provider Notified 08/30/21  Time Provider Notified 2211  Notification Type Page  Notification Reason Other (Comment) (HR 120; mews yellow)  Provider response See new orders  Date of Provider Response 08/30/21  Time of Provider Response 2213  Assess: SIRS CRITERIA  SIRS Temperature  0  SIRS Pulse 1  SIRS Respirations  0  SIRS WBC 0  SIRS Score Sum  1    Mews changed to yellow; pt was c/o headache and stated "fluttery feeling in the chest". VS checked and noticed HR fluctuating from 100's to 130's; pt has no c/o chest pain nor nausea; Provider made aware and received new orders; will continue to monitor pt.

## 2021-08-31 NOTE — Progress Notes (Signed)
Subjective/Chief Complaint: Feels better but feels weak. Noted some fluttering in her chest last night but not this am   Objective: Vital signs in last 24 hours: Temp:  [98.1 F (36.7 C)-98.8 F (37.1 C)] 98.7 F (37.1 C) (09/25 0819) Pulse Rate:  [65-120] 78 (09/25 0819) Resp:  [12-18] 15 (09/25 0819) BP: (110-137)/(46-80) 131/51 (09/25 0819) SpO2:  [97 %-100 %] 100 % (09/25 0819) Last BM Date: 08/31/21  Intake/Output from previous day: 09/24 0701 - 09/25 0700 In: 1857 [P.O.:180; I.V.:1248.8; IV Piggyback:428.3] Out: 1250 [Urine:1200; Stool:50] Intake/Output this shift: No intake/output data recorded.  General appearance: alert and cooperative Resp: clear to auscultation bilaterally Cardio: regular rate and rhythm GI: soft, focal tenderness LLQ but significantly improved from yesterday  Lab Results:  Recent Labs    08/30/21 0422 08/31/21 0409  WBC 17.8* 12.3*  HGB 10.9* 10.6*  HCT 34.1* 33.9*  PLT 222 198   BMET Recent Labs    08/29/21 1044 08/30/21 2345  NA 137 137  K 3.6 3.4*  CL 105 108  CO2 24 21*  GLUCOSE 151* 70  BUN 16 12  CREATININE 0.73 0.50  CALCIUM 8.6* 7.9*   PT/INR No results for input(s): LABPROT, INR in the last 72 hours. ABG No results for input(s): PHART, HCO3 in the last 72 hours.  Invalid input(s): PCO2, PO2  Studies/Results: CT ABDOMEN PELVIS W CONTRAST  Result Date: 08/29/2021 CLINICAL DATA:  Abdominal pain. Nausea-vomiting and diarrhea since yesterday. EXAM: CT ABDOMEN AND PELVIS WITH CONTRAST TECHNIQUE: Multidetector CT imaging of the abdomen and pelvis was performed using the standard protocol following bolus administration of intravenous contrast. CONTRAST:  64m OMNIPAQUE IOHEXOL 350 MG/ML SOLN COMPARISON:  None. FINDINGS: Lower chest: Clear lung bases. Normal heart size without pericardial or pleural effusion. Tiny hiatal hernia. Hepatobiliary: Normal liver. Normal gallbladder, without biliary ductal dilatation. Pancreas:  Normal, without mass or ductal dilatation. Spleen: Normal in size, without focal abnormality. Adrenals/Urinary Tract: Normal adrenal glands. Interpolar left renal scarring with too small to characterize left renal lesions. Normal right kidney. No hydronephrosis. Stomach/Bowel: Normal remainder of the stomach. From the level of the splenic flexure on 12/02 through the proximal sigmoid on 55/2, there is moderate colonic wall thickening, pericolonic edema, and suggestion of hypoenhancement. Normal terminal ileum.  Normal small bowel. Vascular/Lymphatic: Aortic atherosclerosis. Patent mesenteric vessels. No abdominopelvic adenopathy. Reproductive: Normal uterus, without adnexal mass. Prominent gonadal veins, primarily on the left. Other: Trace cul-de-sac and right pelvic fluid including on 56/3. No free intraperitoneal air. Musculoskeletal: No acute osseous abnormality. Convex right lumbar spine curvature is mild. IMPRESSION: 1. Left-sided colitis, with appearance and distribution which favor ischemia. Infectious and inflammatory etiologies felt less likely. 2. Small volume right pelvic and cul-de-sac fluid, likely secondary. 3.  Tiny hiatal hernia. 4.  Aortic Atherosclerosis (ICD10-I70.0). 5. 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 Electronically Signed   By: KAbigail MiyamotoM.D.   On: 08/29/2021 14:07    Anti-infectives: Anti-infectives (From admission, onward)    Start     Dose/Rate Route Frequency Ordered Stop   08/30/21 0600  levofloxacin (LEVAQUIN) IVPB 750 mg        750 mg 100 mL/hr over 90 Minutes Intravenous Every 24 hours 08/29/21 1850     08/29/21 2000  metroNIDAZOLE (FLAGYL) IVPB 500 mg        500 mg 100 mL/hr over 60 Minutes Intravenous Every 12 hours 08/29/21 1850     08/29/21 1545  levofloxacin (LEVAQUIN) IVPB  750 mg        750 mg 100 mL/hr over 90 Minutes Intravenous  Once 08/29/21 1536 08/29/21 1748   08/29/21 1545  metroNIDAZOLE (FLAGYL) IVPB 500 mg         500 mg 100 mL/hr over 60 Minutes Intravenous  Once 08/29/21 1536 08/29/21 1717       Assessment/Plan: s/p * No surgery found * Advance diet. Allow clears today Continue IV levaquin and flagyl until wbc normal and pain almost gone then switch to oral abx Ambulate L sided colitis Will need to follow up with GI after d/c for possible colonoscopy  LOS: 2 days    Virginia Fox 08/31/2021

## 2021-08-31 NOTE — Plan of Care (Signed)
  Problem: Coping: Goal: Level of anxiety will decrease Outcome: Progressing   Problem: Pain Managment: Goal: General experience of comfort will improve Outcome: Progressing   Problem: Elimination: Goal: Will not experience complications related to bowel motility Outcome: Progressing Goal: Will not experience complications related to urinary retention Outcome: Progressing

## 2021-09-01 LAB — CBC WITH DIFFERENTIAL/PLATELET
Abs Immature Granulocytes: 0.02 10*3/uL (ref 0.00–0.07)
Basophils Absolute: 0 10*3/uL (ref 0.0–0.1)
Basophils Relative: 0 %
Eosinophils Absolute: 0.1 10*3/uL (ref 0.0–0.5)
Eosinophils Relative: 2 %
HCT: 29.9 % — ABNORMAL LOW (ref 36.0–46.0)
Hemoglobin: 9.7 g/dL — ABNORMAL LOW (ref 12.0–15.0)
Immature Granulocytes: 0 %
Lymphocytes Relative: 16 %
Lymphs Abs: 1.5 10*3/uL (ref 0.7–4.0)
MCH: 30.7 pg (ref 26.0–34.0)
MCHC: 32.4 g/dL (ref 30.0–36.0)
MCV: 94.6 fL (ref 80.0–100.0)
Monocytes Absolute: 1.1 10*3/uL — ABNORMAL HIGH (ref 0.1–1.0)
Monocytes Relative: 12 %
Neutro Abs: 6.6 10*3/uL (ref 1.7–7.7)
Neutrophils Relative %: 70 %
Platelets: 202 10*3/uL (ref 150–400)
RBC: 3.16 MIL/uL — ABNORMAL LOW (ref 3.87–5.11)
RDW: 13.1 % (ref 11.5–15.5)
WBC: 9.4 10*3/uL (ref 4.0–10.5)
nRBC: 0 % (ref 0.0–0.2)

## 2021-09-01 LAB — BASIC METABOLIC PANEL
Anion gap: 5 (ref 5–15)
BUN: 7 mg/dL — ABNORMAL LOW (ref 8–23)
CO2: 23 mmol/L (ref 22–32)
Calcium: 7.9 mg/dL — ABNORMAL LOW (ref 8.9–10.3)
Chloride: 114 mmol/L — ABNORMAL HIGH (ref 98–111)
Creatinine, Ser: 0.45 mg/dL (ref 0.44–1.00)
GFR, Estimated: >60 mL/min (ref >60)
Glucose, Bld: 100 mg/dL — ABNORMAL HIGH (ref 70–99)
Potassium: 3.1 mmol/L — ABNORMAL LOW (ref 3.5–5.1)
Sodium: 142 mmol/L (ref 135–145)

## 2021-09-01 LAB — C DIFFICILE QUICK SCREEN W PCR REFLEX
C Diff antigen: NONREACTIVE — AB
C Diff interpretation: NEGATIVE
C Diff toxin: NONREACTIVE — AB

## 2021-09-01 MED ORDER — POTASSIUM CHLORIDE 20 MEQ PO PACK
60.0000 meq | PACK | Freq: Once | ORAL | Status: AC
  Administered 2021-09-01: 60 meq via ORAL
  Filled 2021-09-01: qty 3

## 2021-09-01 NOTE — Progress Notes (Signed)
PROGRESS NOTE    Virginia Fox  QAS:[id number] DOB: Dec 05, 1951 DOA: 08/29/2021 PCP: Ronnald Nian, DO   Chief Complain: Nausea, vomiting, abdominal pain  Brief Narrative:  Patient is a 70 year old female with history of osteoporosis, GERD, C. difficile presented with abdominal, nausea, vomiting and diarrhea.  Patient recently had travel to [place].  Diarrhea was bloody.  On presentation, she was mildly febrile, normotensive.  Lab work showed leukocytosis.  CT abdomen/pelvis showed left-sided colitis with distribution evidence favoring more ischemia.  General surgery also consulted and following.  On IV antibiotics.  Plan for discharge tomorrow to home with oral antibiotics.  Assessment & Plan:   Principal Problem:   Ischemic colitis ([place]) Active Problems:   Age-related osteoporosis without current pathological fracture    Left-sided ischemic colitis: Presented with abdominal pain, nausea, vomiting, diarrhea.  Diarrhea was bloody.  Had abdominal distention.  CT abdomen/pelvis showed left-sided colitis. Started on bowel rest, IV fluids, started on IV Levaquin and Flagyl.  General surgery following. Felling better now.  Having loose bowel movements .C. difficile, GI pathogen panel negative.  She needs to have a follow-up colonoscopy in 6 to 8 weeks to rule out inflammatory bowel disease. Diet advanced to regular today.  We will plan to discharge her home tomorrow with oral antibiotics.  Abdominal pain is significantly improved  Leukocytosis: Resolved  Hypokalemia: Being supplemented with potassium  Osteoporosis: On Prolia injections          DVT prophylaxis:SCD Code Status: Full Family Communication:None at bedside Status is: Inpatient  Remains inpatient appropriate because:Inpatient level of care appropriate due to severity of illness  Dispo: The patient is from: Home              Anticipated d/c is to: Home              Patient currently is not medically stable  to d/c.   Difficult to place patient No      Consultants: General surgery  Procedures:None  Antimicrobials:  Anti-infectives (From admission, onward)    Start     Dose/Rate Route Frequency Ordered Stop   08/30/21 0600  levofloxacin (LEVAQUIN) IVPB 750 mg        750 mg 100 mL/hr over 90 Minutes Intravenous Every 24 hours 08/29/21 1850     08/29/21 2000  metroNIDAZOLE (FLAGYL) IVPB 500 mg        500 mg 100 mL/hr over 60 Minutes Intravenous Every 12 hours 08/29/21 1850     08/29/21 1545  levofloxacin (LEVAQUIN) IVPB 750 mg        750 mg 100 mL/hr over 90 Minutes Intravenous  Once 08/29/21 1536 08/29/21 1748   08/29/21 1545  metroNIDAZOLE (FLAGYL) IVPB 500 mg        500 mg 100 mL/hr over 60 Minutes Intravenous  Once 08/29/21 1536 08/29/21 1717       Subjective:  Patient seen and examined the bedside this morning.  Hemodynamically stable and comfortable.  Pain has significantly improved.  Loose bowel movements, less bloody.  Respiratory.  Tolerating clear liquid diet   Objective: Vitals:   08/31/21 0819 08/31/21 1839 08/31/21 2130 09/01/21 0639  BP: (!) 131/51 128/63 (!) 146/56 (!) 143/64  Pulse: 78 83 82 77  Resp: 15 18 18 18   Temp: 98.7 F (37.1 C) 98.4 F (36.9 C) 98.5 F (36.9 C) 98.3 F (36.8 C)  TempSrc: Oral Oral Oral Oral  SpO2: 100% 100% 100% 99%  Weight:      Height:  Intake/Output Summary (Last 24 hours) at 09/01/2021 0736 Last data filed at 09/01/2021 0630 Gross per 24 hour  Intake 3564.46 ml  Output 2050 ml  Net 1514.46 ml   Filed Weights   08/29/21 1002  Weight: 59 kg    Examination:  General exam: Overall comfortable, not in distress HEENT: PERRL Respiratory system:  no wheezes or crackles  Cardiovascular system: S1 & S2 heard, RRR.  Gastrointestinal system: Abdomen is nondistended, soft and nontender. Central nervous system: Alert and oriented Extremities: No edema, no clubbing ,no cyanosis Skin: No rashes, no ulcers,no icterus      Data Reviewed: I have personally reviewed following labs and imaging studies  CBC: Recent Labs  Lab 08/29/21 1044 08/30/21 0422 08/31/21 0409 09/01/21 0406  WBC 20.9* 17.8* 12.3* 9.4  NEUTROABS 18.4*  --  8.6* 6.6  HGB 12.3 10.9* 10.6* 9.7*  HCT 37.4 34.1* 33.9* 29.9*  MCV 93.7 95.8 97.7 94.6  PLT 261 222 198 546   Basic Metabolic Panel: Recent Labs  Lab 08/29/21 1044 08/30/21 2345 09/01/21 0406  NA 137 137 142  K 3.6 3.4* 3.1*  CL 105 108 114*  CO2 24 21* 23  GLUCOSE 151* 70 100*  BUN 16 12 7*  CREATININE 0.73 0.50 0.45  CALCIUM 8.6* 7.9* 7.9*   GFR: Estimated Creatinine Clearance: 57.2 mL/min (by C-G formula based on SCr of 0.45 mg/dL). Liver Function Tests: Recent Labs  Lab 08/30/21 2345  AST 17  ALT 20  ALKPHOS 59  BILITOT 0.9  PROT 5.4*  ALBUMIN 3.0*   No results for input(s): LIPASE, AMYLASE in the last 168 hours. No results for input(s): AMMONIA in the last 168 hours. Coagulation Profile: No results for input(s): INR, PROTIME in the last 168 hours. Cardiac Enzymes: No results for input(s): CKTOTAL, CKMB, CKMBINDEX, TROPONINI in the last 168 hours. BNP (last 3 results) No results for input(s): PROBNP in the last 8760 hours. HbA1C: No results for input(s): HGBA1C in the last 72 hours. CBG: No results for input(s): GLUCAP in the last 168 hours. Lipid Profile: No results for input(s): CHOL, HDL, LDLCALC, TRIG, CHOLHDL, LDLDIRECT in the last 72 hours. Thyroid Function Tests: No results for input(s): TSH, T4TOTAL, FREET4, T3FREE, THYROIDAB in the last 72 hours. Anemia Panel: No results for input(s): VITAMINB12, FOLATE, FERRITIN, TIBC, IRON, RETICCTPCT in the last 72 hours. Sepsis Labs: No results for input(s): PROCALCITON, LATICACIDVEN in the last 168 hours.  Recent Results (from the past 240 hour(s))  SARS CORONAVIRUS 2 (TAT 6-24 HRS) Nasopharyngeal Nasopharyngeal Swab     Status: None   Collection Time: 08/29/21  4:24 PM   Specimen:  Nasopharyngeal Swab  Result Value Ref Range Status   SARS Coronavirus 2 NEGATIVE NEGATIVE Final    Comment: (NOTE) SARS-CoV-2 target nucleic acids are NOT DETECTED.  The SARS-CoV-2 RNA is generally detectable in upper and lower respiratory specimens during the acute phase of infection. Negative results do not preclude SARS-CoV-2 infection, do not rule out co-infections with other pathogens, and should not be used as the sole basis for treatment or other patient management decisions. Negative results must be combined with clinical observations, patient history, and epidemiological information. The expected result is Negative.  Fact Sheet for Patients: SugarRoll.be  Fact Sheet for Healthcare Providers: https://www.woods-mathews.com/  This test is not yet approved or cleared by the [place] FDA and  has been authorized for detection and/or diagnosis of SARS-CoV-2 by FDA under an Emergency Use Authorization (EUA). This EUA will remain  in effect (meaning this test can be used) for the duration of the COVID-19 declaration under Se ction 564(b)(1) of the Act, 21 U.S.C. section 360bbb-3(b)(1), unless the authorization is terminated or revoked sooner.  Performed at [facility], [place] [address], [place], [place] [address]   Gastrointestinal Panel by PCR , Stool     Status: None   Collection Time: 08/30/21  4:17 PM   Specimen: Stool  Result Value Ref Range Status   Campylobacter species NOT DETECTED NOT DETECTED Final   Plesimonas shigelloides NOT DETECTED NOT DETECTED Final   Salmonella species NOT DETECTED NOT DETECTED Final   Yersinia enterocolitica NOT DETECTED NOT DETECTED Final   Vibrio species NOT DETECTED NOT DETECTED Final   Vibrio cholerae NOT DETECTED NOT DETECTED Final   Enteroaggregative E coli (EAEC) NOT DETECTED NOT DETECTED Final   Enteropathogenic E coli (EPEC) NOT DETECTED NOT DETECTED Final   Enterotoxigenic E  coli (ETEC) NOT DETECTED NOT DETECTED Final   Shiga like toxin producing E coli (STEC) NOT DETECTED NOT DETECTED Final   Shigella/Enteroinvasive E coli (EIEC) NOT DETECTED NOT DETECTED Final   Cryptosporidium NOT DETECTED NOT DETECTED Final   Cyclospora cayetanensis NOT DETECTED NOT DETECTED Final   Entamoeba histolytica NOT DETECTED NOT DETECTED Final   Giardia lamblia NOT DETECTED NOT DETECTED Final   Adenovirus F40/41 NOT DETECTED NOT DETECTED Final   Astrovirus NOT DETECTED NOT DETECTED Final   Norovirus GI/GII NOT DETECTED NOT DETECTED Final   Rotavirus A NOT DETECTED NOT DETECTED Final   Sapovirus (I, II, IV, and V) NOT DETECTED NOT DETECTED Final    Comment: Performed at [facility], [address] [place], [place] [address]  C Difficile Quick Screen w PCR reflex     Status: Abnormal   Collection Time: 09/01/21  3:00 AM  Result Value Ref Range Status   C Diff antigen NON REACTIVE (A) NEGATIVE Final   C Diff toxin NON REACTIVE (A) NEGATIVE Final   C Diff interpretation NEGATIVE  Final    Comment: Performed at [facility], [place] [address] [place], [place] [address]         Radiology Studies: No results found.      Scheduled Meds:  potassium chloride  60 mEq Oral Once   Continuous Infusions:  sodium chloride 75 mL/hr at 09/01/21 0539   levofloxacin (LEVAQUIN) IV 750 mg (09/01/21 0541)   metronidazole 500 mg (08/31/21 1951)     LOS: 3 days    Time spent: 25 mins.More than 50% of that time was spent in counseling and/or coordination of care.      Shelly Coss, MD [facility] P9/26/2022, 7:36 AM

## 2021-09-01 NOTE — Progress Notes (Signed)
Subjective: CC: Patient reports she has not had any abdominal pain for last 24 hours.  She reports she is tolerating clear liquids but does not like the options.  No nausea or vomiting.  Passing flatus.  She had a liquidy BM this morning.  Objective: Vital signs in last 24 hours: Temp:  [98.3 F (36.8 C)-98.5 F (36.9 C)] 98.3 F (36.8 C) (09/26 0639) Pulse Rate:  [77-83] 77 (09/26 0639) Resp:  [18] 18 (09/26 0639) BP: (128-146)/(56-64) 143/64 (09/26 0639) SpO2:  [99 %-100 %] 99 % (09/26 0639) Last BM Date: 08/31/21  Intake/Output from previous day: 09/25 0701 - 09/26 0700 In: 3564.5 [P.O.:1560; I.V.:1701.6; IV Piggyback:302.8] Out: 2050 [Urine:2050] Intake/Output this shift: No intake/output data recorded.  PE: Gen:  Alert, NAD, pleasant Pulm: rate and effort normal Abd: Soft, ND, NT +BS Ext:  No LE edema or calf tenderness Psych: A&Ox3  Skin: no rashes noted, warm and dry  Lab Results:  Recent Labs    08/31/21 0409 09/01/21 0406  WBC 12.3* 9.4  HGB 10.6* 9.7*  HCT 33.9* 29.9*  PLT 198 202   BMET Recent Labs    08/30/21 2345 09/01/21 0406  NA 137 142  K 3.4* 3.1*  CL 108 114*  CO2 21* 23  GLUCOSE 70 100*  BUN 12 7*  CREATININE 0.50 0.45  CALCIUM 7.9* 7.9*   PT/INR No results for input(s): LABPROT, INR in the last 72 hours. CMP     Component Value Date/Time   NA 142 09/01/2021 0406   K 3.1 (L) 09/01/2021 0406   CL 114 (H) 09/01/2021 0406   CO2 23 09/01/2021 0406   GLUCOSE 100 (H) 09/01/2021 0406   BUN 7 (L) 09/01/2021 0406   CREATININE 0.45 09/01/2021 0406   CALCIUM 7.9 (L) 09/01/2021 0406   PROT 5.4 (L) 08/30/2021 2345   ALBUMIN 3.0 (L) 08/30/2021 2345   AST 17 08/30/2021 2345   ALT 20 08/30/2021 2345   ALKPHOS 59 08/30/2021 2345   BILITOT 0.9 08/30/2021 2345   GFRNONAA >60 09/01/2021 0406   Lipase  No results found for: LIPASE  Studies/Results: No results found.  Anti-infectives: Anti-infectives (From admission, onward)     Start     Dose/Rate Route Frequency Ordered Stop   08/30/21 0600  levofloxacin (LEVAQUIN) IVPB 750 mg        750 mg 100 mL/hr over 90 Minutes Intravenous Every 24 hours 08/29/21 1850     08/29/21 2000  metroNIDAZOLE (FLAGYL) IVPB 500 mg        500 mg 100 mL/hr over 60 Minutes Intravenous Every 12 hours 08/29/21 1850     08/29/21 1545  levofloxacin (LEVAQUIN) IVPB 750 mg        750 mg 100 mL/hr over 90 Minutes Intravenous  Once 08/29/21 1536 08/29/21 1748   08/29/21 1545  metroNIDAZOLE (FLAGYL) IVPB 500 mg        500 mg 100 mL/hr over 60 Minutes Intravenous  Once 08/29/21 1536 08/29/21 1717        Assessment/Plan Left sided colitis - Patient's abdominal pain has resolved.  She is nontender on exam.  She has return of bowel function.  She is tolerating clear liquids.  Advance diet. - Continue antibiotics.  Would recommend a 10-14-day course.  Can start transitioning to p.o. - Recommend GI consultation inpatient versus outpatient for possible colonoscopy  FEN - Reg VTE - SCDs, okay for chemical prophylaxis from general surgery standpoint ID - Levofloxacin/Flagyl  LOS: 3 days    Jillyn Ledger , [facility] Surgery 09/01/2021, 8:44 AM Please see Amion for pager number during day hours 7:00am-4:30pm

## 2021-09-02 LAB — BASIC METABOLIC PANEL
Anion gap: 4 — ABNORMAL LOW (ref 5–15)
BUN: 5 mg/dL — ABNORMAL LOW (ref 8–23)
CO2: 25 mmol/L (ref 22–32)
Calcium: 8.4 mg/dL — ABNORMAL LOW (ref 8.9–10.3)
Chloride: 112 mmol/L — ABNORMAL HIGH (ref 98–111)
Creatinine, Ser: 0.49 mg/dL (ref 0.44–1.00)
GFR, Estimated: >60 mL/min (ref >60)
Glucose, Bld: 102 mg/dL — ABNORMAL HIGH (ref 70–99)
Potassium: 3.7 mmol/L (ref 3.5–5.1)
Sodium: 141 mmol/L (ref 135–145)

## 2021-09-02 MED ORDER — METRONIDAZOLE 500 MG PO TABS: 500.0000 mg | ORAL_TABLET | Freq: Three times a day (TID) | ORAL | 0 refills | Status: AC

## 2021-09-02 MED ORDER — LEVOFLOXACIN 750 MG PO TABS: 750.0000 mg | ORAL_TABLET | Freq: Every day | ORAL | 0 refills | Status: AC

## 2021-09-02 MED ORDER — METRONIDAZOLE 500 MG PO TABS
500.0000 mg | ORAL_TABLET | Freq: Two times a day (BID) | ORAL | Status: DC
  Administered 2021-09-02: 500 mg via ORAL
  Filled 2021-09-02: qty 1

## 2021-09-02 NOTE — Discharge Summary (Signed)
Physician Discharge Summary  Virginia Fox:[id number] DOB: [birth year] DOA: 08/29/2021  PCP: Ronnald Nian, DO  Admit date: 08/29/2021 Discharge date: 09/02/2021  Admitted From: Home Disposition:  Home  Discharge Condition:Stable CODE STATUS:FULL Diet recommendation: Regular    Brief/Interim Summary: Patient is a 70 year old female with history of osteoporosis, GERD, C. difficile presented with abdominal, nausea, vomiting and diarrhea.  Patient recently had travel to [place].  Diarrhea was bloody.  On presentation, she was mildly febrile, normotensive.  Lab work showed leukocytosis.  CT abdomen/pelvis showed left-sided colitis with distribution evidence favoring more ischemia.  General surgery also consulted and following.  Started  IV antibiotics.  C. difficile and GI pathogen came  negative.  Symptomatically, she has significantly improved.  Her abdominal pain has resolved, still have some liquid stool but much better than before.  She is medically stable for discharge to home with oral antibiotics.  She needs to follow-up with gastroenterology as an outpatient to schedule a colonoscopy in 6 to 8 weeks.  Following problems were addressed during her hospitalization:  Left-sided ischemic colitis: Presented with abdominal pain, nausea, vomiting, diarrhea.  Diarrhea was bloody.  Had abdominal distention.  CT abdomen/pelvis showed left-sided colitis. Started on bowel rest, IV fluids, started on IV Levaquin and Flagyl.  General surgery were following.  C. difficile and GI pathogen came  negative.  Symptomatically, she has significantly improved.  Her abdominal pain has resolved, still have some liquid stool but much better than before.  She is medically stable for discharge to home with oral antibiotics.  She needs to follow-up with gastroenterology as an outpatient to schedule a colonoscopy in 6 to 8 weeks.  Leukocytosis: Resolved   Hypokalemia: Being supplemented with potassium    Osteoporosis: On Prolia injections     Discharge Diagnoses:  Principal Problem:   Ischemic colitis ([place]) Active Problems:   Age-related osteoporosis without current pathological fracture    Discharge Instructions  Discharge Instructions     Diet general   Complete by: As directed    Discharge instructions   Complete by: As directed    1)Please take prescribed medications as instructed 2)Follow up with your PCP in a week.  Follow-up with gastroenterology as an outpatient for scheduling a colonoscopy in 6 to 8 weeks   Increase activity slowly   Complete by: As directed       Allergies as of 09/02/2021   No Known Allergies      Medication List     TAKE these medications    denosumab 60 MG/ML Soln injection Commonly known as: PROLIA Inject 60 mg into the skin every 6 (six) months. Administer in upper arm, thigh, or abdomen   levofloxacin 750 MG tablet Commonly known as: Levaquin Take 1 tablet (750 mg total) by mouth daily for 5 days. Start taking on: September 03, 2021   metroNIDAZOLE 500 MG tablet Commonly known as: Flagyl Take 1 tablet (500 mg total) by mouth 3 (three) times daily for 6 days.   multivitamin with minerals tablet Take 1 tablet by mouth daily.   polyvinyl alcohol 1.4 % ophthalmic solution Commonly known as: LIQUIFILM TEARS Place 1 drop into both eyes as needed for dry eyes.        Follow-up Information     Ronnald Nian, DO. Schedule an appointment as soon as possible for a visit in 1 week(s).   Specialty: Family Medicine Contact information: [place] [address] [phone]  No Known Allergies  Consultations: General surgery   Procedures/Studies: CT ABDOMEN PELVIS W CONTRAST  Result Date: 08/29/2021 CLINICAL DATA:  Abdominal pain. Nausea-vomiting and diarrhea since yesterday. EXAM: CT ABDOMEN AND PELVIS WITH CONTRAST TECHNIQUE: Multidetector CT imaging of the abdomen and  pelvis was performed using the standard protocol following bolus administration of intravenous contrast. CONTRAST:  55m OMNIPAQUE IOHEXOL 350 MG/ML SOLN COMPARISON:  None. FINDINGS: Lower chest: Clear lung bases. Normal heart size without pericardial or pleural effusion. Tiny hiatal hernia. Hepatobiliary: Normal liver. Normal gallbladder, without biliary ductal dilatation. Pancreas: Normal, without mass or ductal dilatation. Spleen: Normal in size, without focal abnormality. Adrenals/Urinary Tract: Normal adrenal glands. Interpolar left renal scarring with too small to characterize left renal lesions. Normal right kidney. No hydronephrosis. Stomach/Bowel: Normal remainder of the stomach. From the level of the splenic flexure on 12/02 through the proximal sigmoid on 55/2, there is moderate colonic wall thickening, pericolonic edema, and suggestion of hypoenhancement. Normal terminal ileum.  Normal small bowel. Vascular/Lymphatic: Aortic atherosclerosis. Patent mesenteric vessels. No abdominopelvic adenopathy. Reproductive: Normal uterus, without adnexal mass. Prominent gonadal veins, primarily on the left. Other: Trace cul-de-sac and right pelvic fluid including on 56/3. No free intraperitoneal air. Musculoskeletal: No acute osseous abnormality. Convex right lumbar spine curvature is mild. IMPRESSION: 1. Left-sided colitis, with appearance and distribution which favor ischemia. Infectious and inflammatory etiologies felt less likely. 2. Small volume right pelvic and cul-de-sac fluid, likely secondary. 3.  Tiny hiatal hernia. 4.  Aortic Atherosclerosis (ICD10-I70.0). 5. 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 Electronically Signed   By: KAbigail MiyamotoM.D.   On: 08/29/2021 14:07      Subjective: Patient seen and examined at the bedside this morning.  Hemodynamically stable for discharge today.  Discharge Exam: Vitals:   09/01/21 2055 09/02/21 0607  BP: (!) 145/57 (!) 141/57   Pulse: 77 77  Resp: 18 18  Temp: 98.3 F (36.8 C) 98.5 F (36.9 C)  SpO2: 100% 100%   Vitals:   09/01/21 1033 09/01/21 1351 09/01/21 2055 09/02/21 0607  BP: (!) 140/56 (!) 141/53 (!) 145/57 (!) 141/57  Pulse: 77 77 77 77  Resp: 15 15 18 18   Temp: 97.8 F (36.6 C) 98.2 F (36.8 C) 98.3 F (36.8 C) 98.5 F (36.9 C)  TempSrc: Oral Oral Oral Oral  SpO2: 100% 100% 100% 100%  Weight:      Height:        General exam: Overall comfortable, not in distress HEENT: PERRL Respiratory system:  no wheezes or crackles  Cardiovascular system: S1 & S2 heard, RRR.  Gastrointestinal system: Abdomen is nondistended, soft and nontender. Central nervous system: Alert and oriented Extremities: No edema, no clubbing ,no cyanosis Skin: No rashes, no ulcers,no icterus     The results of significant diagnostics from this hospitalization (including imaging, microbiology, ancillary and laboratory) are listed below for reference.     Microbiology: Recent Results (from the past 240 hour(s))  SARS CORONAVIRUS 2 (TAT 6-24 HRS) Nasopharyngeal Nasopharyngeal Swab     Status: None   Collection Time: 08/29/21  4:24 PM   Specimen: Nasopharyngeal Swab  Result Value Ref Range Status   SARS Coronavirus 2 NEGATIVE NEGATIVE Final    Comment: (NOTE) SARS-CoV-2 target nucleic acids are NOT DETECTED.  The SARS-CoV-2 RNA is generally detectable in upper and lower respiratory specimens during the acute phase of infection. Negative results do not preclude SARS-CoV-2 infection, do not rule out co-infections with other pathogens, and  should not be used as the sole basis for treatment or other patient management decisions. Negative results must be combined with clinical observations, patient history, and epidemiological information. The expected result is Negative.  Fact Sheet for Patients: SugarRoll.be  Fact Sheet for Healthcare  Providers: https://www.woods-mathews.com/  This test is not yet approved or cleared by the [place] FDA and  has been authorized for detection and/or diagnosis of SARS-CoV-2 by FDA under an Emergency Use Authorization (EUA). This EUA will remain  in effect (meaning this test can be used) for the duration of the COVID-19 declaration under Se ction 564(b)(1) of the Act, 21 U.S.C. section 360bbb-3(b)(1), unless the authorization is terminated or revoked sooner.  Performed at [facility], [place] [address], [place], [place] [address]   Gastrointestinal Panel by PCR , Stool     Status: None   Collection Time: 08/30/21  4:17 PM   Specimen: Stool  Result Value Ref Range Status   Campylobacter species NOT DETECTED NOT DETECTED Final   Plesimonas shigelloides NOT DETECTED NOT DETECTED Final   Salmonella species NOT DETECTED NOT DETECTED Final   Yersinia enterocolitica NOT DETECTED NOT DETECTED Final   Vibrio species NOT DETECTED NOT DETECTED Final   Vibrio cholerae NOT DETECTED NOT DETECTED Final   Enteroaggregative E coli (EAEC) NOT DETECTED NOT DETECTED Final   Enteropathogenic E coli (EPEC) NOT DETECTED NOT DETECTED Final   Enterotoxigenic E coli (ETEC) NOT DETECTED NOT DETECTED Final   Shiga like toxin producing E coli (STEC) NOT DETECTED NOT DETECTED Final   Shigella/Enteroinvasive E coli (EIEC) NOT DETECTED NOT DETECTED Final   Cryptosporidium NOT DETECTED NOT DETECTED Final   Cyclospora cayetanensis NOT DETECTED NOT DETECTED Final   Entamoeba histolytica NOT DETECTED NOT DETECTED Final   Giardia lamblia NOT DETECTED NOT DETECTED Final   Adenovirus F40/41 NOT DETECTED NOT DETECTED Final   Astrovirus NOT DETECTED NOT DETECTED Final   Norovirus GI/GII NOT DETECTED NOT DETECTED Final   Rotavirus A NOT DETECTED NOT DETECTED Final   Sapovirus (I, II, IV, and V) NOT DETECTED NOT DETECTED Final    Comment: Performed at [facility], [address]  [place], [place] [address]  C Difficile Quick Screen w PCR reflex     Status: Abnormal   Collection Time: 09/01/21  3:00 AM  Result Value Ref Range Status   C Diff antigen NON REACTIVE (A) NEGATIVE Final   C Diff toxin NON REACTIVE (A) NEGATIVE Final   C Diff interpretation NEGATIVE  Final    Comment: Performed at [facility], [place] [address], [place], [place] [address]     Labs: BNP (last 3 results) No results for input(s): BNP in the last 8760 hours. Basic Metabolic Panel: Recent Labs  Lab 08/29/21 1044 08/30/21 2345 09/01/21 0406 09/02/21 0454  NA 137 137 142 141  K 3.6 3.4* 3.1* 3.7  CL 105 108 114* 112*  CO2 24 21* 23 25  GLUCOSE 151* 70 100* 102*  BUN 16 12 7* 5*  CREATININE 0.73 0.50 0.45 0.49  CALCIUM 8.6* 7.9* 7.9* 8.4*   Liver Function Tests: Recent Labs  Lab 08/30/21 2345  AST 17  ALT 20  ALKPHOS 59  BILITOT 0.9  PROT 5.4*  ALBUMIN 3.0*   No results for input(s): LIPASE, AMYLASE in the last 168 hours. No results for input(s): AMMONIA in the last 168 hours. CBC: Recent Labs  Lab 08/29/21 1044 08/30/21 0422 08/31/21 0409 09/01/21 0406  WBC 20.9*  17.8* 12.3* 9.4  NEUTROABS 18.4*  --  8.6* 6.6  HGB 12.3 10.9* 10.6* 9.7*  HCT 37.4 34.1* 33.9* 29.9*  MCV 93.7 95.8 97.7 94.6  PLT 261 222 198 202   Cardiac Enzymes: No results for input(s): CKTOTAL, CKMB, CKMBINDEX, TROPONINI in the last 168 hours. BNP: Invalid input(s): POCBNP CBG: No results for input(s): GLUCAP in the last 168 hours. D-Dimer No results for input(s): DDIMER in the last 72 hours. Hgb A1c No results for input(s): HGBA1C in the last 72 hours. Lipid Profile No results for input(s): CHOL, HDL, LDLCALC, TRIG, CHOLHDL, LDLDIRECT in the last 72 hours. Thyroid function studies No results for input(s): TSH, T4TOTAL, T3FREE, THYROIDAB in the last 72 hours.  Invalid input(s): FREET3 Anemia work up No results for input(s): VITAMINB12, FOLATE, FERRITIN, TIBC, IRON,  RETICCTPCT in the last 72 hours. Urinalysis No results found for: COLORURINE, APPEARANCEUR, [place], [place], [place], [place], [place], [place], PROTEINUR, UROBILINOGEN, NITRITE, LEUKOCYTESUR Sepsis Labs Invalid input(s): PROCALCITONIN,  WBC,  LACTICIDVEN Microbiology Recent Results (from the past 240 hour(s))  SARS CORONAVIRUS 2 (TAT 6-24 HRS) Nasopharyngeal Nasopharyngeal Swab     Status: None   Collection Time: 08/29/21  4:24 PM   Specimen: Nasopharyngeal Swab  Result Value Ref Range Status   SARS Coronavirus 2 NEGATIVE NEGATIVE Final    Comment: (NOTE) SARS-CoV-2 target nucleic acids are NOT DETECTED.  The SARS-CoV-2 RNA is generally detectable in upper and lower respiratory specimens during the acute phase of infection. Negative results do not preclude SARS-CoV-2 infection, do not rule out co-infections with other pathogens, and should not be used as the sole basis for treatment or other patient management decisions. Negative results must be combined with clinical observations, patient history, and epidemiological information. The expected result is Negative.  Fact Sheet for Patients: SugarRoll.be  Fact Sheet for Healthcare Providers: https://www.woods-mathews.com/  This test is not yet approved or cleared by the [place] FDA and  has been authorized for detection and/or diagnosis of SARS-CoV-2 by FDA under an Emergency Use Authorization (EUA). This EUA will remain  in effect (meaning this test can be used) for the duration of the COVID-19 declaration under Se ction 564(b)(1) of the Act, 21 U.S.C. section 360bbb-3(b)(1), unless the authorization is terminated or revoked sooner.  Performed at [facility], [place] [address], [place], [place] [address]   Gastrointestinal Panel by PCR , Stool     Status: None   Collection Time: 08/30/21  4:17 PM   Specimen: Stool  Result Value Ref Range Status   Campylobacter species  NOT DETECTED NOT DETECTED Final   Plesimonas shigelloides NOT DETECTED NOT DETECTED Final   Salmonella species NOT DETECTED NOT DETECTED Final   Yersinia enterocolitica NOT DETECTED NOT DETECTED Final   Vibrio species NOT DETECTED NOT DETECTED Final   Vibrio cholerae NOT DETECTED NOT DETECTED Final   Enteroaggregative E coli (EAEC) NOT DETECTED NOT DETECTED Final   Enteropathogenic E coli (EPEC) NOT DETECTED NOT DETECTED Final   Enterotoxigenic E coli (ETEC) NOT DETECTED NOT DETECTED Final   Shiga like toxin producing E coli (STEC) NOT DETECTED NOT DETECTED Final   Shigella/Enteroinvasive E coli (EIEC) NOT DETECTED NOT DETECTED Final   Cryptosporidium NOT DETECTED NOT DETECTED Final   Cyclospora cayetanensis NOT DETECTED NOT DETECTED Final   Entamoeba histolytica NOT DETECTED NOT DETECTED Final   Giardia lamblia NOT DETECTED NOT DETECTED Final   Adenovirus F40/41 NOT DETECTED NOT DETECTED Final   Astrovirus NOT DETECTED NOT DETECTED Final  Norovirus GI/GII NOT DETECTED NOT DETECTED Final   Rotavirus A NOT DETECTED NOT DETECTED Final   Sapovirus (I, II, IV, and V) NOT DETECTED NOT DETECTED Final    Comment: Performed at [facility], [place], [place] [address]  C Difficile Quick Screen w PCR reflex     Status: Abnormal   Collection Time: 09/01/21  3:00 AM  Result Value Ref Range Status   C Diff antigen NON REACTIVE (A) NEGATIVE Final   C Diff toxin NON REACTIVE (A) NEGATIVE Final   C Diff interpretation NEGATIVE  Final    Comment: Performed at [facility], [place] [address] [place], [place] [address]    Please note: You were cared for by a hospitalist during your hospital stay. Once you are discharged, your primary care physician will handle any further medical issues. Please note that NO REFILLS for any discharge medications will be authorized once you are discharged, as it is imperative that you return to your primary care physician (or  establish a relationship with a primary care physician if you do not have one) for your post hospital discharge needs so that they can reassess your need for medications and monitor your lab values.    Time coordinating discharge: 40 minutes  SIGNED:   Shelly Coss, MD  [facility] 09/02/2021, 10:07 AM Pager [phone]  If 7PM-7AM, please contact night-coverage [email or web address] Password [email or web address]

## 2021-09-02 NOTE — Care Management Important Message (Signed)
Important Message  Patient Details IM Letter given to the Patient. Name: Virginia Fox MRN: [id number] Date of Birth: 1951-11-11   Medicare Important Message Given:  Yes     Kerin Salen 09/02/2021, 11:53 AM

## 2021-09-02 NOTE — Plan of Care (Signed)
Instructions were reviewed with patient. All questions were answered. Patient was transported to main entrance by wheelchair. ° °

## 2021-09-02 NOTE — Care Management Important Message (Signed)
Important Message  Patient Details IM Letter given to the Patient. Name: SKYLA CHAMPAGNE MRN: [id number] Date of Birth: 07/25/1951   Medicare Important Message Given:  Yes     Kerin Salen 09/02/2021, 11:54 AM

## 2021-09-02 NOTE — Progress Notes (Signed)
Subjective: CC: Abdominal pain remains resolved. No nausea or emesis with regular diet. Passing flatus and loose stools. Ambulating.   Objective: Vital signs in last 24 hours: Temp:  [97.8 F (36.6 C)-98.5 F (36.9 C)] 98.5 F (36.9 C) (09/27 0607) Pulse Rate:  [77] 77 (09/27 0607) Resp:  [15-18] 18 (09/27 0607) BP: (140-145)/(53-57) 141/57 (09/27 0607) SpO2:  [100 %] 100 % (09/27 0607) Last BM Date: 09/01/21  Intake/Output from previous day: 09/26 0701 - 09/27 0700 In: 1814.2 [P.O.:1440; IV Piggyback:374.2] Out: 1400 [Urine:1400] Intake/Output this shift: No intake/output data recorded.  PE: Gen:  Alert, NAD, pleasant Pulm: rate and effort normal Abd: Soft, ND, NT +BS Ext:  No LE edema or calf tenderness Psych: A&Ox3  Skin: no rashes noted, warm and dry  Lab Results:  Recent Labs    08/31/21 0409 09/01/21 0406  WBC 12.3* 9.4  HGB 10.6* 9.7*  HCT 33.9* 29.9*  PLT 198 202    BMET Recent Labs    09/01/21 0406 09/02/21 0454  NA 142 141  K 3.1* 3.7  CL 114* 112*  CO2 23 25  GLUCOSE 100* 102*  BUN 7* 5*  CREATININE 0.45 0.49  CALCIUM 7.9* 8.4*    PT/INR No results for input(s): LABPROT, INR in the last 72 hours. CMP     Component Value Date/Time   NA 141 09/02/2021 0454   K 3.7 09/02/2021 0454   CL 112 (H) 09/02/2021 0454   CO2 25 09/02/2021 0454   GLUCOSE 102 (H) 09/02/2021 0454   BUN 5 (L) 09/02/2021 0454   CREATININE 0.49 09/02/2021 0454   CALCIUM 8.4 (L) 09/02/2021 0454   PROT 5.4 (L) 08/30/2021 2345   ALBUMIN 3.0 (L) 08/30/2021 2345   AST 17 08/30/2021 2345   ALT 20 08/30/2021 2345   ALKPHOS 59 08/30/2021 2345   BILITOT 0.9 08/30/2021 2345   GFRNONAA >60 09/02/2021 0454   Lipase  No results found for: LIPASE  Studies/Results: No results found.  Anti-infectives: Anti-infectives (From admission, onward)    Start     Dose/Rate Route Frequency Ordered Stop   09/02/21 0800  metroNIDAZOLE (FLAGYL) tablet 500 mg        500 mg  Oral Every 12 hours 09/02/21 0724     08/30/21 0600  levofloxacin (LEVAQUIN) IVPB 750 mg        750 mg 100 mL/hr over 90 Minutes Intravenous Every 24 hours 08/29/21 1850     08/29/21 2000  metroNIDAZOLE (FLAGYL) IVPB 500 mg  Status:  Discontinued        500 mg 100 mL/hr over 60 Minutes Intravenous Every 12 hours 08/29/21 1850 09/02/21 0724   08/29/21 1545  levofloxacin (LEVAQUIN) IVPB 750 mg        750 mg 100 mL/hr over 90 Minutes Intravenous  Once 08/29/21 1536 08/29/21 1748   08/29/21 1545  metroNIDAZOLE (FLAGYL) IVPB 500 mg        500 mg 100 mL/hr over 60 Minutes Intravenous  Once 08/29/21 1536 08/29/21 1717        Assessment/Plan Left sided colitis - Patient's abdominal pain remains resolved.  She is nontender on exam.  She has return of bowel function.  She is tolerating regular diet - Continue antibiotics.  Would recommend a 10-14-day course.  Can start transitioning to p.o. - Recommend GI consultation inpatient versus outpatient for possible colonoscopy  - stable for discharge from general surgery perspective. We will sign off. Please reach out with  any questions or concerns  FEN - Reg VTE - SCDs, okay for chemical prophylaxis from general surgery standpoint ID - Levofloxacin/Flagyl    LOS: 4 days    Winferd Humphrey , [facility] 09/02/2021, 8:24 AM Please see Amion for pager number during day hours 7:00am-4:30pm

## 2021-09-03 ENCOUNTER — Encounter: Payer: Self-pay | Admitting: Internal Medicine

## 2021-09-16 ENCOUNTER — Other Ambulatory Visit: Payer: Self-pay

## 2021-09-17 ENCOUNTER — Encounter: Payer: Self-pay | Admitting: Family Medicine

## 2021-09-17 ENCOUNTER — Ambulatory Visit: Payer: PPO

## 2021-09-17 ENCOUNTER — Ambulatory Visit (INDEPENDENT_AMBULATORY_CARE_PROVIDER_SITE_OTHER): Payer: PPO | Admitting: Family Medicine

## 2021-09-17 VITALS — BP 142/70 | HR 68 | Temp 97.1°F | Ht 65.0 in | Wt 130.0 lb

## 2021-09-17 DIAGNOSIS — M810 Age-related osteoporosis without current pathological fracture: Secondary | ICD-10-CM

## 2021-09-17 DIAGNOSIS — K559 Vascular disorder of intestine, unspecified: Secondary | ICD-10-CM | POA: Diagnosis not present

## 2021-09-17 DIAGNOSIS — Z23 Encounter for immunization: Secondary | ICD-10-CM | POA: Diagnosis not present

## 2021-09-17 DIAGNOSIS — I700 Atherosclerosis of aorta: Secondary | ICD-10-CM

## 2021-09-17 LAB — COMPREHENSIVE METABOLIC PANEL
ALT: 13 U/L (ref 0–35)
AST: 20 U/L (ref 0–37)
Albumin: 4.2 g/dL (ref 3.5–5.2)
Alkaline Phosphatase: 55 U/L (ref 39–117)
BUN: 13 mg/dL (ref 6–23)
CO2: 28 mEq/L (ref 19–32)
Calcium: 9.1 mg/dL (ref 8.4–10.5)
Chloride: 101 mEq/L (ref 96–112)
Creatinine, Ser: 0.63 mg/dL (ref 0.40–1.20)
GFR: 88.85 mL/min (ref >60.00)
Glucose, Bld: 79 mg/dL (ref 70–99)
Potassium: 4.1 mEq/L (ref 3.5–5.1)
Sodium: 137 mEq/L (ref 135–145)
Total Bilirubin: 0.3 mg/dL (ref 0.2–1.2)
Total Protein: 6.3 g/dL (ref 6.0–8.3)

## 2021-09-17 LAB — CBC
HCT: 37.7 % (ref 36.0–46.0)
Hemoglobin: 12.2 g/dL (ref 12.0–15.0)
MCHC: 32.3 g/dL (ref 30.0–36.0)
MCV: 92.9 fl (ref 78.0–100.0)
Platelets: 352 10*3/uL (ref 150.0–400.0)
RBC: 4.06 Mil/uL (ref 3.87–5.11)
RDW: 13.7 % (ref 11.5–15.5)
WBC: 7.9 10*3/uL (ref 4.0–10.5)

## 2021-09-17 MED ORDER — DENOSUMAB 60 MG/ML ~~LOC~~ SOSY
60.0000 mg | PREFILLED_SYRINGE | Freq: Once | SUBCUTANEOUS | Status: AC
Start: 2021-09-17 — End: 2021-09-17
  Administered 2021-09-17: 60 mg via SUBCUTANEOUS

## 2021-09-17 NOTE — Progress Notes (Signed)
Established Patient Office Visit  Subjective:  Patient ID: Virginia Fox, female    DOB: November 20, 1951  Age: 70 y.o. MRN: [id number]  CC:  Chief Complaint  Patient presents with   Follow-up    Hosp f/u    HPI Virginia Fox presents for hospital discharge follow-up when she was diagnosed with ischemic colitis.  She has finished her course of antibiotics and feeling better.  Stool is semiformed.  There has been no further abdominal pain to speak of fever or chills.  Follow-up with GI as scheduled in 6 to 8 weeks.  No history of anemia.  She does not smoke.  She does not have hypertension.  She has a favorable lipid profile with an LDL of 99 and an HDL of 74.  Review of the CT scan taken at the time of her illness showed evidence for ischemic colitis.  Atherosclerosis was noted in the aorta with patent mesenteric arteries  Past Medical History:  Diagnosis Date   Allergy    Heart murmur    Osteoporosis     Past Surgical History:  Procedure Laterality Date   AUGMENTATION MAMMAPLASTY Bilateral    BREAST ENHANCEMENT SURGERY     CESAREAN SECTION      Family History  Problem Relation Age of Onset   Diabetes Father    Hypertension Father    Multiple sclerosis Mother     Social History   Socioeconomic History   Marital status: Widowed    Spouse name: Not on file   Number of children: Not on file   Years of education: Not on file   Highest education level: Not on file  Occupational History   Occupation: Retired  Tobacco Use   Smoking status: Never    Passive exposure: Never   Smokeless tobacco: Never  Vaping Use   Vaping Use: Never used  Substance and Sexual Activity   Alcohol use: Yes    Comment: socially   Drug use: No   Sexual activity: Not Currently  Other Topics Concern   Not on file  Social History Narrative   Not on file   Social Determinants of Health   Financial Resource Strain: Low Risk    Difficulty of Paying Living Expenses: Not hard at all   Food Insecurity: No Food Insecurity   Worried About Charity fundraiser in the Last Year: Never true   [place] in the Last Year: Never true  Transportation Needs: No Transportation Needs   Lack of Transportation (Medical): No   Lack of Transportation (Non-Medical): No  Physical Activity: Sufficiently Active   Days of Exercise per Week: 5 days   Minutes of Exercise per Session: 50 min  Stress: No Stress Concern Present   Feeling of Stress : Not at all  Social Connections: Moderately Integrated   Frequency of Communication with Friends and Family: More than three times a week   Frequency of Social Gatherings with Friends and Family: More than three times a week   Attends Religious Services: 1 to 4 times per year   Active Member of [employer] or Organizations: Yes   Attends Archivist Meetings: 1 to 4 times per year   Marital Status: Widowed  Human resources officer Violence: Not At Risk   Fear of Current or Ex-Partner: No   Emotionally Abused: No   Physically Abused: No   Sexually Abused: No    Outpatient Medications Prior to Visit  Medication Sig Dispense Refill   denosumab (  PROLIA) 60 MG/ML SOLN injection Inject 60 mg into the skin every 6 (six) months. Administer in upper arm, thigh, or abdomen     Multiple Vitamins-Minerals (MULTIVITAMIN WITH MINERALS) tablet Take 1 tablet by mouth daily.     Polyethyl Glyc-Propyl Glyc PF (SYSTANE ULTRA PF) 0.4-0.3 % SOLN      polyvinyl alcohol (LIQUIFILM TEARS) 1.4 % ophthalmic solution Place 1 drop into both eyes as needed for dry eyes.     No facility-administered medications prior to visit.    No Known Allergies  ROS Review of Systems  Constitutional:  Negative for diaphoresis, fatigue, fever and unexpected weight change.  HENT: Negative.    Respiratory: Negative.    Cardiovascular: Negative.   Gastrointestinal:  Negative for abdominal pain, anal bleeding, blood in stool, constipation, diarrhea, nausea and vomiting.   Genitourinary: Negative.   Musculoskeletal:  Negative for gait problem and joint swelling.  Skin: Negative.   Neurological:  Negative for speech difficulty and weakness.  Hematological:  Does not bruise/bleed easily.  Psychiatric/Behavioral: Negative.       Objective:    Physical Exam Vitals and nursing note reviewed.  Constitutional:      General: She is not in acute distress.    Appearance: Normal appearance. She is normal weight. She is not ill-appearing, toxic-appearing or diaphoretic.  HENT:     Head: Normocephalic and atraumatic.     Right Ear: Tympanic membrane, ear canal and external ear normal.     Left Ear: Tympanic membrane, ear canal and external ear normal.     Mouth/Throat:     Mouth: Mucous membranes are moist.     Pharynx: Oropharynx is clear. No oropharyngeal exudate or posterior oropharyngeal erythema.  Eyes:     General: No scleral icterus.       Right eye: No discharge.        Left eye: No discharge.     Extraocular Movements: Extraocular movements intact.     Conjunctiva/sclera: Conjunctivae normal.     Pupils: Pupils are equal, round, and reactive to light.  Neck:     Vascular: No carotid bruit.  Cardiovascular:     Rate and Rhythm: Normal rate and regular rhythm.  Pulmonary:     Effort: Pulmonary effort is normal.     Breath sounds: Normal breath sounds.  Abdominal:     General: Bowel sounds are normal. There is no distension.     Palpations: There is no mass.     Tenderness: There is no abdominal tenderness. There is no guarding or rebound.     Hernia: No hernia is present.  Musculoskeletal:     Cervical back: No rigidity or tenderness.  Lymphadenopathy:     Cervical: No cervical adenopathy.  Skin:    General: Skin is warm and dry.  Neurological:     Mental Status: She is alert and oriented to person, place, and time.  Psychiatric:        Mood and Affect: Mood normal.        Behavior: Behavior normal.    BP (!) 142/70 (BP Location: Right  Arm, Patient Position: Sitting, Cuff Size: Normal)   Pulse 68   Temp (!) 97.1 F (36.2 C) (Temporal)   Ht 5' 5"  (1.651 m)   Wt 130 lb (59 kg)   SpO2 99%   BMI 21.63 kg/m  Wt Readings from Last 3 Encounters:  09/17/21 130 lb (59 kg)  08/29/21 130 lb (59 kg)  05/22/21 132 lb 3.2 oz (60  kg)     Health Maintenance Due  Topic Date Due   Zoster Vaccines- Shingrix (1 of 2) Never done   COVID-19 Vaccine (4 - Booster for Pfizer series) 01/20/2021   INFLUENZA VACCINE  07/07/2021    There are no preventive care reminders to display for this patient.  No results found for: TSH Lab Results  Component Value Date   WBC 9.4 09/01/2021   HGB 9.7 (L) 09/01/2021   HCT 29.9 (L) 09/01/2021   MCV 94.6 09/01/2021   PLT 202 09/01/2021   Lab Results  Component Value Date   NA 141 09/02/2021   K 3.7 09/02/2021   CO2 25 09/02/2021   GLUCOSE 102 (H) 09/02/2021   BUN 5 (L) 09/02/2021   CREATININE 0.49 09/02/2021   BILITOT 0.9 08/30/2021   ALKPHOS 59 08/30/2021   AST 17 08/30/2021   ALT 20 08/30/2021   PROT 5.4 (L) 08/30/2021   ALBUMIN 3.0 (L) 08/30/2021   CALCIUM 8.4 (L) 09/02/2021   ANIONGAP 4 (L) 09/02/2021   GFR 81.22 05/22/2021   Lab Results  Component Value Date   CHOL 210 (H) 05/22/2021   Lab Results  Component Value Date   HDL 76.70 05/22/2021   Lab Results  Component Value Date   LDLCALC 124 (H) 05/22/2021   Lab Results  Component Value Date   TRIG 48.0 05/22/2021   Lab Results  Component Value Date   CHOLHDL 3 05/22/2021   No results found for: HGBA1C    Assessment & Plan:   Problem List Items Addressed This Visit       Cardiovascular and Mediastinum   Atherosclerosis of aorta (HCC)     Digestive   Ischemic colitis ([place]) - Primary   Relevant Orders   CBC   Comprehensive metabolic panel     Musculoskeletal and Integument   Age-related osteoporosis without current pathological fracture   Relevant Medications   denosumab (PROLIA) injection 60 mg  (Start on 09/17/2021 12:15 PM)     Other   Flu vaccine need   Relevant Orders   Flu vaccine HIGH DOSE PF (Fluzone High dose)    Meds ordered this encounter  Medications   denosumab (PROLIA) injection 60 mg    Order Specific Question:   Patient is enrolled in [facility] program for this medication and I have provided a copy of the Prolia Medication Guide and Patient Brochure.    Answer:   Yes    Order Specific Question:   I have reviewed with the patient the information in the Prolia Medication Guide and Patient Counseling Chart including the serious risks of Prolia and symptoms of each risk.    Answer:   Yes    Order Specific Question:   I have advised the patient to seek medical attention if they have signs or symptoms of any of the serious risks.    Answer:   Yes     Follow-up: Return in about 3 months (around 12/18/2021), or if symptoms worsen or fail to improve.  Question whether or not a statin may be needed for this patient.  We will decide after GI follow-up.  Libby Maw, MD

## 2021-09-19 ENCOUNTER — Other Ambulatory Visit: Payer: Self-pay | Admitting: Obstetrics & Gynecology

## 2021-09-19 ENCOUNTER — Other Ambulatory Visit: Payer: Self-pay | Admitting: Family Medicine

## 2021-09-19 DIAGNOSIS — Z1231 Encounter for screening mammogram for malignant neoplasm of breast: Secondary | ICD-10-CM

## 2021-10-17 ENCOUNTER — Encounter: Payer: Self-pay | Admitting: *Deleted

## 2021-10-21 ENCOUNTER — Other Ambulatory Visit: Payer: Self-pay

## 2021-10-21 ENCOUNTER — Ambulatory Visit
Admission: RE | Admit: 2021-10-21 | Discharge: 2021-10-21 | Disposition: A | Discharge status: Home / Self Care | Payer: PPO | Source: Ambulatory Visit | Attending: Family Medicine | Admitting: Family Medicine

## 2021-10-21 DIAGNOSIS — Z1231 Encounter for screening mammogram for malignant neoplasm of breast: Secondary | ICD-10-CM

## 2021-10-24 ENCOUNTER — Encounter: Payer: Self-pay | Admitting: Internal Medicine

## 2021-10-24 ENCOUNTER — Ambulatory Visit: Payer: PPO | Admitting: Internal Medicine

## 2021-10-24 ENCOUNTER — Ambulatory Visit (INDEPENDENT_AMBULATORY_CARE_PROVIDER_SITE_OTHER): Payer: PPO | Admitting: Internal Medicine

## 2021-10-24 VITALS — BP 146/70 | HR 76 | Ht 65.0 in | Wt 131.2 lb

## 2021-10-24 DIAGNOSIS — K625 Hemorrhage of anus and rectum: Secondary | ICD-10-CM

## 2021-10-24 DIAGNOSIS — K529 Noninfective gastroenteritis and colitis, unspecified: Secondary | ICD-10-CM | POA: Diagnosis not present

## 2021-10-24 MED ORDER — PLENVU 140 G PO SOLR: 140.0000 g | ORAL | 0 refills | Status: DC

## 2021-10-24 NOTE — Progress Notes (Signed)
Chief Complaint: Colitis  HPI : 70 year old female with history of GERD, hiatal hernia, anal fissure presents for follow-up of colitis that was noted on imaging during her recent hospitalization.  She was recently hospitalized 08/29/2021 to 09/02/2021 for presumed left-sided ischemic colitis.  She had recently traveled to [place] and then subsequently developed bloody diarrhea at that time.  During that hospitalization, she was treated with bowel rest, IV fluids, and antibiotics.  She had significant improvement in her symptoms during the course of her hospitalization.  Currently the patient states that she feels like she is back to normal.  She is having at least one BM per day. She feels like she clears out fully from her colon. Denies abdominal pain. Denies weight loss, N&V, or dysphagia. She is eating well. Has some scant bright red blood per rectum on occasion that she attributes to hemorrhoids.  These scant episodes of bleeding have been occurring for years.  Denies rectal pain.  Her last colonoscopy was in 2016 that showed hemorrhoids only.  Denies family history of IBD or colon cancer to her knowledge.  Denies use of blood thinners.   Past Medical History:  Diagnosis Date   Allergy    Anal fissure    GERD (gastroesophageal reflux disease)    Heart murmur    Hiatal hernia    Internal hemorrhoids    Osteoporosis      Past Surgical History:  Procedure Laterality Date   AUGMENTATION MAMMAPLASTY Bilateral    BREAST ENHANCEMENT SURGERY     CESAREAN SECTION     x 2   WISDOM TOOTH EXTRACTION     Family History  Problem Relation Age of Onset   Multiple sclerosis Mother    Diabetes Father    Hypertension Father    Colon cancer Neg Hx    Esophageal cancer Neg Hx    Pancreatic cancer Neg Hx    Stomach cancer Neg Hx    Liver disease Neg Hx    Social History   Tobacco Use   Smoking status: Never    Passive exposure: Never   Smokeless tobacco: Never  Vaping Use   Vaping Use:  Never used  Substance Use Topics   Alcohol use: Yes    Comment: socially   Drug use: No   Current Outpatient Medications  Medication Sig Dispense Refill   denosumab (PROLIA) 60 MG/ML SOLN injection Inject 60 mg into the skin every 6 (six) months. Administer in upper arm, thigh, or abdomen     Lactobacillus-Inulin (CULTURELLE DIGESTIVE DAILY PO) Take 1 capsule by mouth daily.     Multiple Vitamins-Minerals (MULTIVITAMIN WITH MINERALS) tablet Take 1 tablet by mouth daily.     Polyethyl Glyc-Propyl Glyc PF (SYSTANE ULTRA PF) 0.4-0.3 % SOLN      No current facility-administered medications for this visit.   No Known Allergies   Review of Systems: All systems reviewed and negative except where noted in HPI.   Physical Exam: BP (!) 146/70   Pulse 76   Ht 5' 5"  (1.651 m)   Wt 131 lb 3.2 oz (59.5 kg)   BMI 21.83 kg/m  Constitutional: Pleasant,well-developed, female in no acute distress. HEENT: Normocephalic and atraumatic. Conjunctivae are normal. No scleral icterus. Cardiovascular: Normal rate, regular rhythm.  Pulmonary/chest: Effort normal and breath sounds normal. No wheezing, rales or rhonchi. Abdominal: Soft, nondistended, nontender. Bowel sounds active throughout. There are no masses palpable. No hepatomegaly. Extremities: No edema Neurological: Alert and oriented to person place and time. Skin:  Skin is warm and dry. No rashes noted. Psychiatric: Normal mood and affect. Behavior is normal.  Labs 09/2021: CBC and CMP were unremarkable  CT A/P w/contrast 08/29/21: IMPRESSION: 1. Left-sided colitis, with appearance and distribution which favor ischemia. Infectious and inflammatory etiologies felt less likely. 2. Small volume right pelvic and cul-de-sac fluid, likely secondary. 3.  Tiny hiatal hernia. 4.  Aortic Atherosclerosis (ICD10-I70.0). 5. 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  ASSESSMENT AND PLAN: Left sided colitis Rectal  bleeding Patient presents after recent hospitalization for presumed ischemic colitis (though could also be infectious colitis or diverticulitis).  CT A/P at that time showed left-sided colitis with a distribution that favored ischemia.  Unclear what precipitated that ischemic episode.  Symptomatically the patient has fully recovered.  Since it has been about 8 years since her last colonoscopy, will plan for colonoscopy to assess the area with colitis on imaging recently.  - Colonoscopy [facility]  [name], MD

## 2021-10-24 NOTE — Patient Instructions (Signed)
If you are age 72 or older, your body mass index should be between 23-30. Your Body mass index is 21.83 kg/m. If this is out of the aforementioned range listed, please consider follow up with your Primary Care Provider.  If you are age 4 or younger, your body mass index should be between 19-25. Your Body mass index is 21.83 kg/m. If this is out of the aformentioned range listed, please consider follow up with your Primary Care Provider.   ________________________________________________________  The [facility] providers would like to encourage you to use [facility] to communicate with providers for non-urgent requests or questions.  Due to long hold times on the telephone, sending your provider a message by [facility] may be a faster and more efficient way to get a response.  Please allow 48 business hours for a response.  Please remember that this is for non-urgent requests.  _______________________________________________________  Dennis Bast have been scheduled for a colonoscopy. Please follow written instructions given to you at your visit today.  Please pick up your prep supplies at the pharmacy within the next 1-3 days. If you use inhalers (even only as needed), please bring them with you on the day of your procedure.  Due to recent changes in healthcare laws, you may see the results of your imaging and laboratory studies on MyChart before your provider has had a chance to review them.  We understand that in some cases there may be results that are confusing or concerning to you. Not all laboratory results come back in the same time frame and the provider may be waiting for multiple results in order to interpret others.  Please give [place] 48 hours in order for your provider to thoroughly review all the results before contacting the office for clarification of your results.   It was a pleasure to see you today!  Thank you for trusting me with your gastrointestinal care!

## 2021-11-05 DIAGNOSIS — J019 Acute sinusitis, unspecified: Secondary | ICD-10-CM | POA: Diagnosis not present

## 2021-11-05 DIAGNOSIS — H6690 Otitis media, unspecified, unspecified ear: Secondary | ICD-10-CM | POA: Diagnosis not present

## 2021-11-06 ENCOUNTER — Ambulatory Visit: Payer: PPO | Admitting: Nurse Practitioner

## 2021-11-25 ENCOUNTER — Telehealth: Payer: Self-pay | Admitting: Internal Medicine

## 2021-11-25 NOTE — Telephone Encounter (Signed)
Patient called this morning requesting to speak with Dr. Libby Maw nurse.  She had gotten sick and was prescribed amoxicillin. She has had cramping and diarrhea off and on since then, even after discontinuing the amoxicillin.  She wanted to speak with someone about what she should be doing.  Please call.  Thank you.

## 2021-11-25 NOTE — Telephone Encounter (Signed)
Called the patient back regarding her GI symptoms.  She states that over the last few days she has been having some difficulty passing stools.  Does not feel like she has fully cleared out her colon.  She has been eating slightly less and drinking about a normal amount.  She had some loose stools and abdominal cramping during his recent sinus infection.  I told the patient that antibiotics can sometimes cause issues with loose stools and abdominal cramping.  Patient does not have any alarm features at this time.  I offered the patient an antispasmodic to help with any abdominal cramping, and she was not interested at this time.  If the patient were to have more difficulties with constipation or diarrhea, patient will call us back to let us know.

## 2021-11-25 NOTE — Telephone Encounter (Signed)
Returned pt call to inquire further. States she has had a sinus infection and was treated with Amoxicillin. Has since stopped her Amoxicillin d/t completed treatment. States during the time she was ill, she had loose stool without rectal bleeding but did have abd cramping without pain. Denies having fever when ill or having a fever now. States she had not been eating during her sinus infection and while she was on ATB's. Has since begun eating a regular diet. Is still having abd cramping without abd pain. Feels she needs to defecate but cannot empty her bowels completely. States her stool is now a pudding consistency and with mucus. Drinking 6-8 8 ounce glasses of water per day and taking a probiotic. Is not taking any stool softeners or laxatives. Scheduled for colonoscopy 12/16/21 and may be able to come in sooner if there is an opening. Advised I will route this message to Dr. Lorenso Courier for her to review and to address further.

## 2021-12-13 DIAGNOSIS — R059 Cough, unspecified: Secondary | ICD-10-CM | POA: Diagnosis not present

## 2021-12-13 DIAGNOSIS — R0982 Postnasal drip: Secondary | ICD-10-CM | POA: Diagnosis not present

## 2021-12-16 ENCOUNTER — Encounter: Payer: Self-pay | Admitting: Internal Medicine

## 2021-12-16 ENCOUNTER — Ambulatory Visit (AMBULATORY_SURGERY_CENTER): Payer: PPO | Admitting: Internal Medicine

## 2021-12-16 VITALS — BP 111/53 | HR 92 | Temp 98.7°F | Resp 17 | Ht 65.0 in | Wt 131.0 lb

## 2021-12-16 DIAGNOSIS — K625 Hemorrhage of anus and rectum: Secondary | ICD-10-CM | POA: Diagnosis not present

## 2021-12-16 DIAGNOSIS — K51511 Left sided colitis with rectal bleeding: Secondary | ICD-10-CM | POA: Diagnosis not present

## 2021-12-16 DIAGNOSIS — K5289 Other specified noninfective gastroenteritis and colitis: Secondary | ICD-10-CM

## 2021-12-16 DIAGNOSIS — R933 Abnormal findings on diagnostic imaging of other parts of digestive tract: Secondary | ICD-10-CM

## 2021-12-16 DIAGNOSIS — K648 Other hemorrhoids: Secondary | ICD-10-CM

## 2021-12-16 MED ORDER — SODIUM CHLORIDE 0.9 % IV SOLN: 500.0000 mL | Freq: Once | INTRAVENOUS | Status: DC

## 2021-12-16 NOTE — Progress Notes (Signed)
Sedate, gd SR, tolerated procedure well, VSS, report to RN 

## 2021-12-16 NOTE — Patient Instructions (Signed)
Impression/Recommendations:  Hemorrhoid handout given to patinet.  Await pathology results.  YOU HAD AN ENDOSCOPIC PROCEDURE TODAY AT [facility]:   Refer to the procedure report that was given to you for any specific questions about what was found during the examination.  If the procedure report does not answer your questions, please call your gastroenterologist to clarify.  If you requested that your care partner not be given the details of your procedure findings, then the procedure report has been included in a sealed envelope for you to review at your convenience later.  YOU SHOULD EXPECT: Some feelings of bloating in the abdomen. Passage of more gas than usual.  Walking can help get rid of the air that was put into your GI tract during the procedure and reduce the bloating. If you had a lower endoscopy (such as a colonoscopy or flexible sigmoidoscopy) you may notice spotting of blood in your stool or on the toilet paper. If you underwent a bowel prep for your procedure, you may not have a normal bowel movement for a few days.  Please Note:  You might notice some irritation and congestion in your nose or some drainage.  This is from the oxygen used during your procedure.  There is no need for concern and it should clear up in a day or so.  SYMPTOMS TO REPORT IMMEDIATELY:  Following lower endoscopy (colonoscopy or flexible sigmoidoscopy):  Excessive amounts of blood in the stool  Significant tenderness or worsening of abdominal pains  Swelling of the abdomen that is new, acute  Fever of 100F or higher For urgent or emergent issues, a gastroenterologist can be reached at any hour by calling [phone]. Do not use MyChart messaging for urgent concerns.    DIET:  We do recommend a small meal at first, but then you may proceed to your regular diet.  Drink plenty of fluids but you should avoid alcoholic beverages for 24 hours.  ACTIVITY:  You should plan to take it easy  for the rest of today and you should NOT DRIVE or use heavy machinery until tomorrow (because of the sedation medicines used during the test).    FOLLOW UP: Our staff will call the number listed on your records 48-72 hours following your procedure to check on you and address any questions or concerns that you may have regarding the information given to you following your procedure. If we do not reach you, we will leave a message.  We will attempt to reach you two times.  During this call, we will ask if you have developed any symptoms of COVID 19. If you develop any symptoms (ie: fever, flu-like symptoms, shortness of breath, cough etc.) before then, please call [phone].  If you test positive for Covid 19 in the 2 weeks post procedure, please call and report this information to [place].    If any biopsies were taken you will be contacted by phone or by letter within the next 1-3 weeks.  Please call us at [phone] if you have not heard about the biopsies in 3 weeks.    SIGNATURES/CONFIDENTIALITY: You and/or your care partner have signed paperwork which will be entered into your electronic medical record.  These signatures attest to the fact that that the information above on your After Visit Summary has been reviewed and is understood.  Full responsibility of the confidentiality of this discharge information lies with you and/or your care-partner.

## 2021-12-16 NOTE — Op Note (Signed)
[facility] Patient Name: Virginia Fox Procedure Date: 12/16/2021 11:28 AM MRN: [id number] Endoscopist: Sonny Masters "[name] ,  Age: 71 Referring MD:  Date of Birth: 1951/09/05 Gender: Female Account #: [id number] Procedure:                Colonoscopy Indications:              Abnormal CT of the GI tract Medicines:                Monitored Anesthesia Care Procedure:                Pre-Anesthesia Assessment:                           - Prior to the procedure, a History and Physical                            was performed, and patient medications and                            allergies were reviewed. The patient's tolerance of                            previous anesthesia was also reviewed. The risks                            and benefits of the procedure and the sedation                            options and risks were discussed with the patient.                            All questions were answered, and informed consent                            was obtained. Prior Anticoagulants: The patient has                            taken no previous anticoagulant or antiplatelet                            agents. ASA Grade Assessment: II - A patient with                            mild systemic disease. After reviewing the risks                            and benefits, the patient was deemed in                            satisfactory condition to undergo the procedure.                           After obtaining informed consent, the colonoscope  was passed under direct vision. Throughout the                            procedure, the patient's blood pressure, pulse, and                            oxygen saturations were monitored continuously. The                            Olympus PCF-H190DL [id number]) Colonoscope was                            introduced through the anus and advanced to the the                            cecum, identified by  appendiceal orifice and                            ileocecal valve. The colonoscopy was performed                            without difficulty. The patient tolerated the                            procedure well. The quality of the bowel                            preparation was adequate. The ileocecal valve,                            appendiceal orifice, and rectum were photographed. Scope In: 11:36:35 AM Scope Out: 11:58:59 AM Scope Withdrawal Time: 0 hours 12 minutes 54 seconds  Total Procedure Duration: 0 hours 22 minutes 24 seconds  Findings:                 A localized area of mildly erythematous mucosa and                            signs of scarring were found in the descending                            colon. This was biopsied with a cold forceps for                            histology.                           Non-bleeding internal hemorrhoids were found during                            retroflexion. Complications:            No immediate complications. Estimated Blood Loss:     Estimated blood loss was minimal. Impression:               - Erythematous mucosa and  scarring in the                            descending colon. Biopsied.                           - Non-bleeding internal hemorrhoids. Recommendation:           - Discharge patient to home (with escort).                           - It is suspected that your rectal bleeding is due                            to hemorrhoids.                           - Await pathology results.                           - The findings and recommendations were discussed                            with the patient. Sonny Masters "[name],  12/16/2021 12:07:33 PM

## 2021-12-16 NOTE — Progress Notes (Signed)
Called to room to assist during endoscopic procedure.  Patient ID and intended procedure confirmed with present staff. Received instructions for my participation in the procedure from the performing physician.  

## 2021-12-16 NOTE — Progress Notes (Signed)
GASTROENTEROLOGY PROCEDURE H&P NOTE   Primary Care Physician: Libby Maw, MD    Reason for Procedure:   Colitis  Plan:    Colonoscopy  Patient is appropriate for endoscopic procedure(s) in the ambulatory ([facility]) setting.  The nature of the procedure, as well as the risks, benefits, and alternatives were carefully and thoroughly reviewed with the patient. Ample time for discussion and questions allowed. The patient understood, was satisfied, and agreed to proceed.     HPI: Virginia Fox is a 71 y.o. female who presents for colonoscopy for evaluation of colitis .  Patient was most recently seen in the Gastroenterology Clinic on 10/24/21.  No interval change in medical history since that appointment. Please refer to that note for full details regarding GI history and clinical presentation.   Past Medical History:  Diagnosis Date   Allergy    Anal fissure    Atherosclerosis of aorta ([place])    seem on CT scan   Cataract    bilateral cataracts removed   GERD (gastroesophageal reflux disease)    Heart murmur    Hiatal hernia    Internal hemorrhoids    Osteoporosis     Past Surgical History:  Procedure Laterality Date   AUGMENTATION MAMMAPLASTY Bilateral    BREAST ENHANCEMENT SURGERY     CESAREAN SECTION     x 2   COLONOSCOPY     WISDOM TOOTH EXTRACTION      Prior to Admission medications   Medication Sig Start Date End Date Taking? Authorizing Provider  dextromethorphan-guaiFENesin (MUCINEX DM) 30-600 MG 12hr tablet Take 1 tablet by mouth 2 (two) times daily.   Yes [provider]  Lactobacillus-Inulin (CULTURELLE DIGESTIVE DAILY PO) Take 1 capsule by mouth daily.   Yes [provider]  Loratadine 10 MG CAPS  12/07/16  Yes [provider]  Multiple Vitamins-Minerals (MULTIVITAMIN WITH MINERALS) tablet Take 1 tablet by mouth daily.   Yes [provider]  Polyethyl Glyc-Propyl Glyc PF (SYSTANE ULTRA PF) 0.4-0.3 % SOLN     Yes [provider]  Azelastine HCl 137 MCG/SPRAY SOLN Place into both nostrils. Patient not taking: Reported on 12/16/2021 12/13/21   [provider]  denosumab (PROLIA) 60 MG/ML SOLN injection Inject 60 mg into the skin every 6 (six) months. Administer in upper arm, thigh, or abdomen    [provider]    Current Outpatient Medications  Medication Sig Dispense Refill   dextromethorphan-guaiFENesin (MUCINEX DM) 30-600 MG 12hr tablet Take 1 tablet by mouth 2 (two) times daily.     Lactobacillus-Inulin (CULTURELLE DIGESTIVE DAILY PO) Take 1 capsule by mouth daily.     Loratadine 10 MG CAPS      Multiple Vitamins-Minerals (MULTIVITAMIN WITH MINERALS) tablet Take 1 tablet by mouth daily.     Polyethyl Glyc-Propyl Glyc PF (SYSTANE ULTRA PF) 0.4-0.3 % SOLN      Azelastine HCl 137 MCG/SPRAY SOLN Place into both nostrils. (Patient not taking: Reported on 12/16/2021)     denosumab (PROLIA) 60 MG/ML SOLN injection Inject 60 mg into the skin every 6 (six) months. Administer in upper arm, thigh, or abdomen     Current Facility-Administered Medications  Medication Dose Route Frequency Provider Last Rate Last Admin   0.9 %  sodium chloride infusion  500 mL Intravenous Once Sharyn Creamer, MD        Allergies as of 12/16/2021   (No Known Allergies)    Family History  Problem Relation Age of Onset  Multiple sclerosis Mother    Diabetes Father    Hypertension Father    Colon cancer Neg Hx    Esophageal cancer Neg Hx    Pancreatic cancer Neg Hx    Stomach cancer Neg Hx    Liver disease Neg Hx    Rectal cancer Neg Hx     Social History   Socioeconomic History   Marital status: Widowed    Spouse name: Not on file   Number of children: Not on file   Years of education: Not on file   Highest education level: Not on file  Occupational History   Occupation: Retired  Tobacco Use   Smoking status: Never    Passive exposure: Never   Smokeless tobacco: Never  Vaping  Use   Vaping Use: Never used  Substance and Sexual Activity   Alcohol use: Yes    Comment: socially   Drug use: No   Sexual activity: Not Currently    Birth control/protection: Post-menopausal  Other Topics Concern   Not on file  Social History Narrative   Not on file   Social Determinants of Health   Financial Resource Strain: Low Risk    Difficulty of Paying Living Expenses: Not hard at all  Food Insecurity: No Food Insecurity   Worried About Charity fundraiser in the Last Year: Never true   [place] in the Last Year: Never true  Transportation Needs: No Transportation Needs   Lack of Transportation (Medical): No   Lack of Transportation (Non-Medical): No  Physical Activity: Sufficiently Active   Days of Exercise per Week: 5 days   Minutes of Exercise per Session: 50 min  Stress: No Stress Concern Present   Feeling of Stress : Not at all  Social Connections: Moderately Integrated   Frequency of Communication with Friends and Family: More than three times a week   Frequency of Social Gatherings with Friends and Family: More than three times a week   Attends Religious Services: 1 to 4 times per year   Active Member of [employer] or Organizations: Yes   Attends Archivist Meetings: 1 to 4 times per year   Marital Status: Widowed  Human resources officer Violence: Not At Risk   Fear of Current or Ex-Partner: No   Emotionally Abused: No   Physically Abused: No   Sexually Abused: No    Physical Exam: Vital signs in last 24 hours: BP (!) 123/47    Pulse 99    Temp 98.7 F (37.1 C)    Ht 5' 5"  (1.651 m)    Wt 131 lb (59.4 kg)    SpO2 99%    BMI 21.80 kg/m  GEN: NAD EYE: Sclerae anicteric ENT: MMM CV: Non-tachycardic Pulm: No increased WOB GI: Soft NEURO:  Alert & Oriented   [name], MD [facility]   12/16/2021 11:16 AM

## 2021-12-16 NOTE — Progress Notes (Signed)
DT - VS  Pt is having a colonoscopy today. She is wearing invisalign braces with rubber bands. maw

## 2021-12-18 ENCOUNTER — Telehealth: Payer: Self-pay

## 2021-12-18 ENCOUNTER — Encounter: Payer: Self-pay | Admitting: Internal Medicine

## 2021-12-18 NOTE — Telephone Encounter (Signed)
°  Follow up Call-  Call back number 12/16/2021  Post procedure Call Back phone  # [phone] cell  Permission to leave phone message Yes  Some recent data might be hidden     Patient questions:  Do you have a fever, pain , or abdominal swelling? No. Pain Score  0 *  Have you tolerated food without any problems? Yes.    Have you been able to return to your normal activities? Yes.    Do you have any questions about your discharge instructions: Diet   No. Medications  No. Follow up visit  No.  Do you have questions or concerns about your Care? No.  Actions: * If pain score is 4 or above: No action needed, pain <4.

## 2021-12-22 ENCOUNTER — Other Ambulatory Visit: Payer: Self-pay

## 2021-12-23 ENCOUNTER — Ambulatory Visit (INDEPENDENT_AMBULATORY_CARE_PROVIDER_SITE_OTHER): Payer: PPO | Admitting: Family Medicine

## 2021-12-23 ENCOUNTER — Encounter: Payer: Self-pay | Admitting: Family Medicine

## 2021-12-23 VITALS — BP 136/74 | HR 72 | Temp 97.0°F | Ht 65.0 in | Wt 126.6 lb

## 2021-12-23 DIAGNOSIS — I739 Peripheral vascular disease, unspecified: Secondary | ICD-10-CM

## 2021-12-23 MED ORDER — ATORVASTATIN CALCIUM 10 MG PO TABS: 10.0000 mg | ORAL_TABLET | Freq: Every day | ORAL | 3 refills | Status: DC

## 2021-12-23 NOTE — Progress Notes (Signed)
Established Patient Office Visit  Subjective:  Patient ID: Virginia Fox, female    DOB: 11-26-1951  Age: 71 y.o. MRN: [id number]  CC:  Chief Complaint  Patient presents with   Transitions Of Care    TOC from Dr. Loletha Grayer, no concerns.     HPI Virginia Fox presents for for follow-up of arterial sclerotic changes noted in her aorta from CT scan of her abdomen.  There was no aneurysm.  She has no history of hypertension, smoking or diabetes.  She has a favorable lipid profile with a mildly elevated LDL cholesterol.  She has no chest pain shortness of breath or dyspnea on exertion.  She leads an active lifestyle.  Her ASCVD risk score is mildly elevated.  Past Medical History:  Diagnosis Date   Allergy    Anal fissure    Atherosclerosis of aorta ([facility])    seem on CT scan   Cataract    bilateral cataracts removed   GERD (gastroesophageal reflux disease)    Heart murmur    Hiatal hernia    Internal hemorrhoids    Osteoporosis     Past Surgical History:  Procedure Laterality Date   AUGMENTATION MAMMAPLASTY Bilateral    BREAST ENHANCEMENT SURGERY     CESAREAN SECTION     x 2   COLONOSCOPY     WISDOM TOOTH EXTRACTION      Family History  Problem Relation Age of Onset   Multiple sclerosis Mother    Diabetes Father    Hypertension Father    Colon cancer Neg Hx    Esophageal cancer Neg Hx    Pancreatic cancer Neg Hx    Stomach cancer Neg Hx    Liver disease Neg Hx    Rectal cancer Neg Hx     Social History   Socioeconomic History   Marital status: Widowed    Spouse name: Not on file   Number of children: Not on file   Years of education: Not on file   Highest education level: Not on file  Occupational History   Occupation: Retired  Tobacco Use   Smoking status: Never    Passive exposure: Never   Smokeless tobacco: Never  Vaping Use   Vaping Use: Never used  Substance and Sexual Activity   Alcohol use: Yes    Comment: socially   Drug use: No   Sexual  activity: Not Currently    Birth control/protection: Post-menopausal  Other Topics Concern   Not on file  Social History Narrative   Not on file   Social Determinants of Health   Financial Resource Strain: Low Risk    Difficulty of Paying Living Expenses: Not hard at all  Food Insecurity: No Food Insecurity   Worried About Charity fundraiser in the Last Year: Never true   [place] in the Last Year: Never true  Transportation Needs: No Transportation Needs   Lack of Transportation (Medical): No   Lack of Transportation (Non-Medical): No  Physical Activity: Sufficiently Active   Days of Exercise per Week: 5 days   Minutes of Exercise per Session: 50 min  Stress: No Stress Concern Present   Feeling of Stress : Not at all  Social Connections: Moderately Integrated   Frequency of Communication with Friends and Family: More than three times a week   Frequency of Social Gatherings with Friends and Family: More than three times a week   Attends Religious Services: 1 to 4 times per  year   Active Member of Clubs or Organizations: Yes   Attends Archivist Meetings: 1 to 4 times per year   Marital Status: Widowed  Human resources officer Violence: Not At Risk   Fear of Current or Ex-Partner: No   Emotionally Abused: No   Physically Abused: No   Sexually Abused: No    Outpatient Medications Prior to Visit  Medication Sig Dispense Refill   Azelastine HCl 137 MCG/SPRAY SOLN Place into both nostrils.     denosumab (PROLIA) 60 MG/ML SOLN injection Inject 60 mg into the skin every 6 (six) months. Administer in upper arm, thigh, or abdomen     Lactobacillus-Inulin (CULTURELLE DIGESTIVE DAILY PO) Take 1 capsule by mouth daily.     Loratadine 10 MG CAPS      Multiple Vitamins-Minerals (MULTIVITAMIN WITH MINERALS) tablet Take 1 tablet by mouth daily.     Polyethyl Glyc-Propyl Glyc PF (SYSTANE ULTRA PF) 0.4-0.3 % SOLN      dextromethorphan-guaiFENesin (MUCINEX DM) 30-600 MG 12hr  tablet Take 1 tablet by mouth 2 (two) times daily.     No facility-administered medications prior to visit.    No Known Allergies  ROS Review of Systems  Constitutional:  Negative for diaphoresis, fatigue, fever and unexpected weight change.  HENT: Negative.    Eyes:  Negative for photophobia and visual disturbance.  Respiratory: Negative.    Cardiovascular: Negative.   Gastrointestinal: Negative.   Genitourinary: Negative.   Neurological: Negative.   Hematological: Negative.   Psychiatric/Behavioral: Negative.       Objective:    Physical Exam Vitals and nursing note reviewed.  Constitutional:      General: She is not in acute distress.    Appearance: Normal appearance. She is normal weight. She is not ill-appearing, toxic-appearing or diaphoretic.  HENT:     Head: Normocephalic and atraumatic.     Right Ear: External ear normal.     Left Ear: External ear normal.     Mouth/Throat:     Mouth: Mucous membranes are moist.     Pharynx: Oropharynx is clear. No oropharyngeal exudate or posterior oropharyngeal erythema.  Eyes:     General:        Right eye: No discharge.        Left eye: No discharge.     Extraocular Movements: Extraocular movements intact.     Conjunctiva/sclera: Conjunctivae normal.     Pupils: Pupils are equal, round, and reactive to light.  Neck:     Vascular: No carotid bruit.  Cardiovascular:     Rate and Rhythm: Normal rate and regular rhythm.     Pulses:          Dorsalis pedis pulses are 2+ on the right side and 2+ on the left side.       Posterior tibial pulses are 2+ on the right side and 2+ on the left side.  Pulmonary:     Effort: Pulmonary effort is normal.     Breath sounds: Normal breath sounds.  Abdominal:     General: Bowel sounds are normal. There is no distension.     Palpations: There is no mass.     Tenderness: There is no abdominal tenderness. There is no guarding or rebound.     Hernia: No hernia is present.   Musculoskeletal:     Cervical back: Normal range of motion and neck supple. No rigidity or tenderness.  Lymphadenopathy:     Cervical: No cervical adenopathy.  Skin:  General: Skin is warm and dry.  Neurological:     Mental Status: She is alert and oriented to person, place, and time.  Psychiatric:        Mood and Affect: Mood normal.        Behavior: Behavior normal.    BP 136/74 (BP Location: Left Arm, Patient Position: Sitting, Cuff Size: Normal)    Pulse 72    Temp (!) 97 F (36.1 C) (Temporal)    Ht 5' 5"  (1.651 m)    Wt 126 lb 9.6 oz (57.4 kg)    SpO2 99%    BMI 21.07 kg/m  Wt Readings from Last 3 Encounters:  12/23/21 126 lb 9.6 oz (57.4 kg)  12/16/21 131 lb (59.4 kg)  10/24/21 131 lb 3.2 oz (59.5 kg)     Health Maintenance Due  Topic Date Due   Zoster Vaccines- Shingrix (1 of 2) Never done    There are no preventive care reminders to display for this patient.  No results found for: TSH Lab Results  Component Value Date   WBC 7.9 09/17/2021   HGB 12.2 09/17/2021   HCT 37.7 09/17/2021   MCV 92.9 09/17/2021   PLT 352.0 09/17/2021   Lab Results  Component Value Date   NA 137 09/17/2021   K 4.1 09/17/2021   CO2 28 09/17/2021   GLUCOSE 79 09/17/2021   BUN 13 09/17/2021   CREATININE 0.63 09/17/2021   BILITOT 0.3 09/17/2021   ALKPHOS 55 09/17/2021   AST 20 09/17/2021   ALT 13 09/17/2021   PROT 6.3 09/17/2021   ALBUMIN 4.2 09/17/2021   CALCIUM 9.1 09/17/2021   ANIONGAP 4 (L) 09/02/2021   GFR 88.85 09/17/2021   Lab Results  Component Value Date   CHOL 210 (H) 05/22/2021   Lab Results  Component Value Date   HDL 76.70 05/22/2021   Lab Results  Component Value Date   LDLCALC 124 (H) 05/22/2021   Lab Results  Component Value Date   TRIG 48.0 05/22/2021   Lab Results  Component Value Date   CHOLHDL 3 05/22/2021   No results found for: HGBA1C    Assessment & Plan:   Problem List Items Addressed This Visit   None Visit Diagnoses      Asymptomatic PVD (peripheral vascular disease) ([place])    -  Primary   Relevant Medications   atorvastatin (LIPITOR) 10 MG tablet       Meds ordered this encounter  Medications   atorvastatin (LIPITOR) 10 MG tablet    Sig: Take 1 tablet (10 mg total) by mouth daily.    Dispense:  90 tablet    Refill:  3   The 10-year ASCVD risk score (Arnett DK, et al., 2019) is: 12.8%   Values used to calculate the score:     Age: 4 years     Sex: Female     Is Non-Hispanic African American: No     Diabetic: No     Tobacco smoker: No     Systolic Blood Pressure: 782 mmHg     Is BP treated: No     HDL Cholesterol: 76.7 mg/dL     Total Cholesterol: 210 mg/dL  Follow-up: Return in about 3 months (around 03/23/2022).  30-minute discussion with shared decision making about whether or not to start a statin.  Even though her ASCVD risk score is mildly elevated I would put her more in the intermediate risk.  Feel that her risk for has more to  do with her age than anything else.  She agrees to try low-dose statin.  She will let me know if she develops any arthralgias or myalgias.  Libby Maw, MD

## 2021-12-31 DIAGNOSIS — L570 Actinic keratosis: Secondary | ICD-10-CM | POA: Diagnosis not present

## 2022-04-01 ENCOUNTER — Ambulatory Visit: Payer: PPO

## 2022-04-07 ENCOUNTER — Ambulatory Visit (INDEPENDENT_AMBULATORY_CARE_PROVIDER_SITE_OTHER): Payer: PPO | Admitting: Family Medicine

## 2022-04-07 ENCOUNTER — Encounter: Payer: Self-pay | Admitting: Family Medicine

## 2022-04-07 VITALS — BP 122/70 | HR 76 | Temp 97.6°F | Ht 65.0 in | Wt 126.8 lb

## 2022-04-07 DIAGNOSIS — Z789 Other specified health status: Secondary | ICD-10-CM

## 2022-04-07 DIAGNOSIS — I700 Atherosclerosis of aorta: Secondary | ICD-10-CM | POA: Diagnosis not present

## 2022-04-07 DIAGNOSIS — T452X1D Poisoning by vitamins, accidental (unintentional), subsequent encounter: Secondary | ICD-10-CM | POA: Diagnosis not present

## 2022-04-07 DIAGNOSIS — Z9189 Other specified personal risk factors, not elsewhere classified: Secondary | ICD-10-CM | POA: Diagnosis not present

## 2022-04-07 MED ORDER — PRAVASTATIN SODIUM 20 MG PO TABS: 20.0000 mg | ORAL_TABLET | Freq: Every day | ORAL | 1 refills | Status: DC

## 2022-04-07 MED ORDER — PRAVASTATIN SODIUM 20 MG PO TABS: 20.0000 mg | ORAL_TABLET | Freq: Every evening | ORAL | 1 refills | Status: DC

## 2022-04-07 NOTE — Progress Notes (Signed)
? ?Established Patient Office Visit ? ?Subjective   ?Patient ID: Virginia Fox, female    DOB: 20-Jan-1951  Age: 71 y.o. MRN: [id number] ? ?Chief Complaint  ?Patient presents with  ? Follow-up  ?  3 month follow up medication, per patient she was unable to take due to leg cramps.   ? ? ?HPI here for follow-up of statin therapy with atorvastatin for intermediate risk of coronary artery disease with a history of calcification of the aorta.  She has no other risk factors.  She was started on atorvastatin and unfortunately developed myalgias in her calf muscles that resolved after discontinuation medication.  Here for her Prolia injection as well.  She has discontinued high-dose vitamin D sometime ago. Vi ? ? ? ? ? ? ?Review of Systems  ?Constitutional:  Negative for chills, diaphoresis, malaise/fatigue and weight loss.  ?HENT: Negative.    ?Eyes: Negative.  Negative for blurred vision and double vision.  ?Cardiovascular:  Negative for chest pain.  ?Gastrointestinal:  Negative for abdominal pain.  ?Genitourinary: Negative.   ?Musculoskeletal:  Negative for falls and myalgias.  ?Neurological:  Negative for speech change, loss of consciousness and weakness.  ?Psychiatric/Behavioral: Negative.    ? ?  ?Objective:  ?  ? ?BP 122/70 (BP Location: Left Arm, Patient Position: Sitting, Cuff Size: Normal)   Pulse 76   Temp 97.6 ?F (36.4 ?C) (Temporal)   Ht 5' 5"  (1.651 m)   Wt 126 lb 12.8 oz (57.5 kg)   SpO2 99%   BMI 21.10 kg/m?  ? ? ?Physical Exam ?Constitutional:   ?   General: She is not in acute distress. ?   Appearance: Normal appearance. She is not ill-appearing, toxic-appearing or diaphoretic.  ?HENT:  ?   Head: Normocephalic and atraumatic.  ?   Right Ear: External ear normal.  ?   Left Ear: External ear normal.  ?Eyes:  ?   General: No scleral icterus.    ?   Right eye: No discharge.     ?   Left eye: No discharge.  ?   Extraocular Movements: Extraocular movements intact.  ?   Conjunctiva/sclera: Conjunctivae  normal.  ?Pulmonary:  ?   Effort: Pulmonary effort is normal. No respiratory distress.  ?Musculoskeletal:  ?   Cervical back: No rigidity or tenderness.  ?Skin: ?   General: Skin is warm and dry.  ?Neurological:  ?   Mental Status: She is alert and oriented to person, place, and time.  ?Psychiatric:     ?   Mood and Affect: Mood normal.     ?   Behavior: Behavior normal.  ? ? ? ?No results found for any visits on 04/07/22. ? ? ? ?The 10-year ASCVD risk score (Arnett DK, et al., 2019) is: 10.5% ? ?  ?Assessment & Plan:  ? ?Problem List Items Addressed This Visit   ? ?  ? Cardiovascular and Mediastinum  ? Atherosclerosis of aorta ([place]) - Primary  ? Relevant Medications  ? pravastatin (PRAVACHOL) 20 MG tablet  ? Other Relevant Orders  ? CT CARDIAC SCORING ([facility] LOCATIONS ONLY)  ?  ? Other  ? Intolerance of drug  ? At risk for coronary artery disease  ? Relevant Medications  ? pravastatin (PRAVACHOL) 20 MG tablet  ? Other Relevant Orders  ? CT CARDIAC SCORING ([facility] LOCATIONS ONLY)  ? ?Other Visit Diagnoses   ? ? Vitamin D overdose, accidental or unintentional, subsequent encounter      ? ?  ? ? ?  Return in about 3 months (around 07/08/2022).  ?We will give pravastatin a chance.  She will take it nightly.  Recheck vitamin D levels next visit. ? ?Libby Maw, MD ? ?

## 2022-04-13 ENCOUNTER — Telehealth: Payer: Self-pay | Admitting: Family Medicine

## 2022-04-13 NOTE — Telephone Encounter (Signed)
Left message for patient to call back and schedule Medicare Annual Wellness Visit (AWV).  ? ?Please offer to do virtually or by telephone.  Left office number and my jabber [phone]. ? ?Last AWV:03/25/2021 ? ?Please schedule at anytime with Nurse Health Advisor. ?  ?

## 2022-04-21 ENCOUNTER — Telehealth: Payer: Self-pay | Admitting: Family Medicine

## 2022-04-21 NOTE — Telephone Encounter (Signed)
Pt called for f/u on Prolia approval. She said she talked to med asst on 5/2 when here for OV with Dr. Ethelene Hal. She said she was advised it would be checked on and we'd call to schedule a nurse visit.  ? ?Call back # [phone] ?

## 2022-04-21 NOTE — Telephone Encounter (Signed)
Spoke with patient informed that she will receive a call this week after meeting with Prolia rep to give status of next injection.  ?

## 2022-04-22 ENCOUNTER — Ambulatory Visit (INDEPENDENT_AMBULATORY_CARE_PROVIDER_SITE_OTHER): Payer: PPO

## 2022-04-22 DIAGNOSIS — Z0000 Encounter for general adult medical examination without abnormal findings: Secondary | ICD-10-CM

## 2022-04-22 NOTE — Patient Instructions (Signed)
Ms. Benham , ?Thank you for taking time to come for your Medicare Wellness Visit. I appreciate your ongoing commitment to your health goals. Please review the following plan we discussed and let me know if I can assist you in the future.  ? ?Screening recommendations/referrals: ?Colonoscopy: 12/16/2021 ?Mammogram: 10/21/2022 ?Bone Density: 01/15/2021 ?Recommended yearly ophthalmology/optometry visit for glaucoma screening and checkup ?Recommended yearly dental visit for hygiene and checkup ? ?Vaccinations: ?Influenza vaccine: completed  ?Pneumococcal vaccine: completed  ?Tdap vaccine: 02/18/2014 ?Shingles vaccine: completed    ? ?Advanced directives: yes  ? ?Conditions/risks identified: none  ? ?Next appointment: none  ? ? ?Preventive Care 73 Years and Older, Female ?Preventive care refers to lifestyle choices and visits with your health care provider that can promote health and wellness. ?What does preventive care include? ?A yearly physical exam. This is also called an annual well check. ?Dental exams once or twice a year. ?Routine eye exams. Ask your health care provider how often you should have your eyes checked. ?Personal lifestyle choices, including: ?Daily care of your teeth and gums. ?Regular physical activity. ?Eating a healthy diet. ?Avoiding tobacco and drug use. ?Limiting alcohol use. ?Practicing safe sex. ?Taking low-dose aspirin every day. ?Taking vitamin and mineral supplements as recommended by your health care provider. ?What happens during an annual well check? ?The services and screenings done by your health care provider during your annual well check will depend on your age, overall health, lifestyle risk factors, and family history of disease. ?Counseling  ?Your health care provider may ask you questions about your: ?Alcohol use. ?Tobacco use. ?Drug use. ?Emotional well-being. ?Home and relationship well-being. ?Sexual activity. ?Eating habits. ?History of falls. ?Memory and ability to  understand (cognition). ?Work and work Statistician. ?Reproductive health. ?Screening  ?You may have the following tests or measurements: ?Height, weight, and BMI. ?Blood pressure. ?Lipid and cholesterol levels. These may be checked every 5 years, or more frequently if you are over 26 years old. ?Skin check. ?Lung cancer screening. You may have this screening every year starting at age 20 if you have a 30-pack-year history of smoking and currently smoke or have quit within the past 15 years. ?Fecal occult blood test (FOBT) of the stool. You may have this test every year starting at age 17. ?Flexible sigmoidoscopy or colonoscopy. You may have a sigmoidoscopy every 5 years or a colonoscopy every 10 years starting at age 4. ?Hepatitis C blood test. ?Hepatitis B blood test. ?Sexually transmitted disease (STD) testing. ?Diabetes screening. This is done by checking your blood sugar (glucose) after you have not eaten for a while (fasting). You may have this done every 1-3 years. ?Bone density scan. This is done to screen for osteoporosis. You may have this done starting at age 56. ?Mammogram. This may be done every 1-2 years. Talk to your health care provider about how often you should have regular mammograms. ?Talk with your health care provider about your test results, treatment options, and if necessary, the need for more tests. ?Vaccines  ?Your health care provider may recommend certain vaccines, such as: ?Influenza vaccine. This is recommended every year. ?Tetanus, diphtheria, and acellular pertussis (Tdap, Td) vaccine. You may need a Td booster every 10 years. ?Zoster vaccine. You may need this after age 89. ?Pneumococcal 13-valent conjugate (PCV13) vaccine. One dose is recommended after age 16. ?Pneumococcal polysaccharide (PPSV23) vaccine. One dose is recommended after age 19. ?Talk to your health care provider about which screenings and vaccines you need and how often you  need them. ?This information is not  intended to replace advice given to you by your health care provider. Make sure you discuss any questions you have with your health care provider. ?Document Released: 12/20/2015 Document Revised: 08/12/2016 Document Reviewed: 09/24/2015 ?Elsevier Interactive Patient Education ? 2017 [place]. ? ?Fall Prevention in the Home ?Falls can cause injuries. They can happen to people of all ages. There are many things you can do to make your home safe and to help prevent falls. ?What can I do on the outside of my home? ?Regularly fix the edges of walkways and driveways and fix any cracks. ?Remove anything that might make you trip as you walk through a door, such as a raised step or threshold. ?Trim any bushes or trees on the path to your home. ?Use bright outdoor lighting. ?Clear any walking paths of anything that might make someone trip, such as rocks or tools. ?Regularly check to see if handrails are loose or broken. Make sure that both sides of any steps have handrails. ?Any raised decks and porches should have guardrails on the edges. ?Have any leaves, snow, or ice cleared regularly. ?Use sand or salt on walking paths during winter. ?Clean up any spills in your garage right away. This includes oil or grease spills. ?What can I do in the bathroom? ?Use night lights. ?Install grab bars by the toilet and in the tub and shower. Do not use towel bars as grab bars. ?Use non-skid mats or decals in the tub or shower. ?If you need to sit down in the shower, use a plastic, non-slip stool. ?Keep the floor dry. Clean up any water that spills on the floor as soon as it happens. ?Remove soap buildup in the tub or shower regularly. ?Attach bath mats securely with double-sided non-slip rug tape. ?Do not have throw rugs and other things on the floor that can make you trip. ?What can I do in the bedroom? ?Use night lights. ?Make sure that you have a light by your bed that is easy to reach. ?Do not use any sheets or blankets that are  too big for your bed. They should not hang down onto the floor. ?Have a firm chair that has side arms. You can use this for support while you get dressed. ?Do not have throw rugs and other things on the floor that can make you trip. ?What can I do in the kitchen? ?Clean up any spills right away. ?Avoid walking on wet floors. ?Keep items that you use a lot in easy-to-reach places. ?If you need to reach something above you, use a strong step stool that has a grab bar. ?Keep electrical cords out of the way. ?Do not use floor polish or wax that makes floors slippery. If you must use wax, use non-skid floor wax. ?Do not have throw rugs and other things on the floor that can make you trip. ?What can I do with my stairs? ?Do not leave any items on the stairs. ?Make sure that there are handrails on both sides of the stairs and use them. Fix handrails that are broken or loose. Make sure that handrails are as long as the stairways. ?Check any carpeting to make sure that it is firmly attached to the stairs. Fix any carpet that is loose or worn. ?Avoid having throw rugs at the top or bottom of the stairs. If you do have throw rugs, attach them to the floor with carpet tape. ?Make sure that you have a light switch  at the top of the stairs and the bottom of the stairs. If you do not have them, ask someone to add them for you. ?What else can I do to help prevent falls? ?Wear shoes that: ?Do not have high heels. ?Have rubber bottoms. ?Are comfortable and fit you well. ?Are closed at the toe. Do not wear sandals. ?If you use a stepladder: ?Make sure that it is fully opened. Do not climb a closed stepladder. ?Make sure that both sides of the stepladder are locked into place. ?Ask someone to hold it for you, if possible. ?Clearly mark and make sure that you can see: ?Any grab bars or handrails. ?First and last steps. ?Where the edge of each step is. ?Use tools that help you move around (mobility aids) if they are needed. These  include: ?Canes. ?Walkers. ?Scooters. ?Crutches. ?Turn on the lights when you go into a dark area. Replace any light bulbs as soon as they burn out. ?Set up your furniture so you have a clear path. Avoid moving your fur

## 2022-04-22 NOTE — Progress Notes (Signed)
Subjective:   Virginia Fox is a 71 y.o. female who presents for Medicare Annual (Subsequent) preventive examination.   I connected with Jupiter Boys  today by telephone and verified that I am speaking with the correct person using two identifiers. Location patient: home Location provider: work Persons participating in the virtual visit: patient, provider.   I discussed the limitations, risks, security and privacy concerns of performing an evaluation and management service by telephone and the availability of in person appointments. I also discussed with the patient that there may be a patient responsible charge related to this service. The patient expressed understanding and verbally consented to this telephonic visit.    Interactive audio and video telecommunications were attempted between this provider and patient, however failed, due to patient having technical difficulties OR patient did not have access to video capability.  We continued and completed visit with audio only.    Review of Systems     Cardiac Risk Factors include: advanced age (>45mn, >>90women)     Objective:    Today's Vitals   There is no height or weight on file to calculate BMI.     04/22/2022   12:55 PM 08/29/2021    6:14 PM 03/25/2021   12:47 PM 01/21/2017    2:13 PM  Advanced Directives  Does Patient Have a Medical Advance Directive? Yes Yes Yes Yes  Type of AParamed[place]of [place] will Healthcare Power of [place] will Living will  Does patient want to make changes to medical advance directive?  No - Patient declined    Copy of HSikesin Chart? No - copy requested No - copy requested No - copy requested     Current Medications (verified) Outpatient Encounter Medications as of 04/22/2022  Medication Sig   Azelaic Acid 15 % gel SMARTSIG:1 sparingly Topical Daily   denosumab (PROLIA) 60 MG/ML SOLN  injection Inject 60 mg into the skin every 6 (six) months. Administer in upper arm, thigh, or abdomen   Loratadine 10 MG CAPS    Multiple Vitamins-Minerals (MULTIVITAMIN WITH MINERALS) tablet Take 1 tablet by mouth daily.   Polyethyl Glyc-Propyl Glyc PF (SYSTANE ULTRA PF) 0.4-0.3 % SOLN    pravastatin (PRAVACHOL) 20 MG tablet Take 1 tablet (20 mg total) by mouth at bedtime.   Azelastine HCl 137 MCG/SPRAY SOLN Place into both nostrils.   Lactobacillus-Inulin (CULTURELLE DIGESTIVE DAILY PO) Take 1 capsule by mouth daily.   No facility-administered encounter medications on file as of 04/22/2022.    Allergies (verified) Atorvastatin   History: Past Medical History:  Diagnosis Date   Allergy    Anal fissure    Atherosclerosis of aorta (HCC)    seem on CT scan   Cataract    bilateral cataracts removed   GERD (gastroesophageal reflux disease)    Heart murmur    Hiatal hernia    Internal hemorrhoids    Osteoporosis    Past Surgical History:  Procedure Laterality Date   AUGMENTATION MAMMAPLASTY Bilateral    BREAST ENHANCEMENT SURGERY     CESAREAN SECTION     x 2   COLONOSCOPY     WISDOM TOOTH EXTRACTION     Family History  Problem Relation Age of Onset   Multiple sclerosis Mother    Diabetes Father    Hypertension Father    Colon cancer Neg Hx    Esophageal cancer Neg Hx    Pancreatic cancer Neg Hx  Stomach cancer Neg Hx    Liver disease Neg Hx    Rectal cancer Neg Hx    Social History   Socioeconomic History   Marital status: Widowed    Spouse name: Not on file   Number of children: Not on file   Years of education: Not on file   Highest education level: Not on file  Occupational History   Occupation: Retired  Tobacco Use   Smoking status: Never    Passive exposure: Never   Smokeless tobacco: Never  Vaping Use   Vaping Use: Never used  Substance and Sexual Activity   Alcohol use: Yes    Comment: socially   Drug use: No   Sexual activity: Not Currently     Birth control/protection: Post-menopausal  Other Topics Concern   Not on file  Social History Narrative   Not on file   Social Determinants of Health   Financial Resource Strain: Low Risk    Difficulty of Paying Living Expenses: Not hard at all  Food Insecurity: No Food Insecurity   Worried About Charity fundraiser in the Last Year: Never true   [place] in the Last Year: Never true  Transportation Needs: No Transportation Needs   Lack of Transportation (Medical): No   Lack of Transportation (Non-Medical): No  Physical Activity: Sufficiently Active   Days of Exercise per Week: 5 days   Minutes of Exercise per Session: 60 min  Stress: No Stress Concern Present   Feeling of Stress : Not at all  Social Connections: Moderately Isolated   Frequency of Communication with Friends and Family: Three times a week   Frequency of Social Gatherings with Friends and Family: Three times a week   Attends Religious Services: Never   Active Member of Clubs or Organizations: Yes   Attends Archivist Meetings: More than 4 times per year   Marital Status: Widowed    Tobacco Counseling Counseling given: Not Answered   Clinical Intake:  Pre-visit preparation completed: Yes  Pain : No/denies pain     Nutritional Risks: None Diabetes: No  How often do you need to have someone help you when you read instructions, pamphlets, or other written materials from your doctor or pharmacy?: 1 - Never What is the last grade level you completed in school?: college  Diabetic?no   Interpreter Needed?: No  Information entered by :: [place] [employer]    04/22/2022   12:58 PM 08/29/2021    6:19 PM  In your present state of health, do you have any difficulty performing the following activities:  Hearing? 0   Vision? 0   Difficulty concentrating or making decisions? 0   Walking or climbing stairs? 0   Dressing or bathing? 0   Doing errands, shopping? 0 0   Preparing Food and eating ? N   Using the Toilet? N   In the past six months, have you accidently leaked urine? N   Do you have problems with loss of bowel control? N   Managing your Medications? N   Managing your Finances? N   Housekeeping or managing your Housekeeping? N     Patient Care Team: Libby Maw, MD as PCP - General (Family Medicine) Jola Schmidt, MD as Consulting Physician (Ophthalmology)  Indicate any recent Medical Services you may have received from other than [facility] providers in the past year (date may be approximate).     Assessment:   This is  a routine wellness examination for Sabina.  Hearing/Vision screen Vision Screening - Comments:: Annual eye exams wear glasses   Dietary issues and exercise activities discussed: Current Exercise Habits: Home exercise routine, Type of exercise: walking, Time (Minutes): 60, Frequency (Times/Week): 5, Weekly Exercise (Minutes/Week): 300, Intensity: Mild, Exercise limited by: None identified   Goals Addressed             This Visit's Progress    Patient Stated   On track    Maintain current healthy active lifestyle       Depression Screen    04/22/2022   12:56 PM 04/22/2022   12:54 PM 04/07/2022    1:09 PM 12/23/2021    1:01 PM 03/25/2021   12:49 PM 12/18/2020   10:06 AM 01/21/2017    2:17 PM  PHQ 2/9 Scores  PHQ - 2 Score 0 0 0 0 0 0 0  PHQ- 9 Score       0    Fall Risk    04/22/2022   12:56 PM 04/07/2022    1:09 PM 12/23/2021    1:01 PM 03/25/2021   12:48 PM 12/18/2020   10:11 AM  Fall Risk   Falls in the past year? 0 0 0 0 0  Number falls in past yr: 0 0 0 0 0  Injury with Fall? 0   0 0  Follow up Falls evaluation completed   Falls prevention discussed     FALL RISK PREVENTION PERTAINING TO THE HOME:  Any stairs in or around the home? No  If so, are there any without handrails? No  Home free of loose throw rugs in walkways, pet beds, electrical cords, etc? Yes  Adequate lighting in your  home to reduce risk of falls? Yes   ASSISTIVE DEVICES UTILIZED TO PREVENT FALLS:  Life alert? No  Use of a cane, walker or w/c? No  Grab bars in the bathroom? No  Shower chair or bench in shower? No  Elevated toilet seat or a handicapped toilet? No     Cognitive Function:  Normal cognitive status assessed by telephone conversation  by this Nurse Health Advisor. No abnormalities found.        Immunizations Immunization History  Administered Date(s) Administered   Fluad Quad(high Dose 65+) 12/18/2020, 09/17/2021   Influenza Split 01/05/2017   Influenza, High Dose Seasonal PF 09/27/2019   Influenza,inj,Quad PF,6+ Mos 01/05/2017, 09/23/2017, 10/14/2018   PFIZER(Purple Top)SARS-COV-2 Vaccination 01/14/2020, 02/08/2020, 09/19/2020   Pneumococcal Conjugate-13 05/22/2021   Tdap 02/18/2014   Zoster Recombinat (Shingrix) 01/16/2022    TDAP status: Up to date  Flu Vaccine status: Up to date  Pneumococcal vaccine status: Up to date  Covid-19 vaccine status: Completed vaccines  Qualifies for Shingles Vaccine? Yes   Zostavax completed Yes   Shingrix Completed?: Yes  Screening Tests Health Maintenance  Topic Date Due   Zoster Vaccines- Shingrix (2 of 2) 03/13/2022   Pneumonia Vaccine 88+ Years old (2 - PPSV23 if available, else PCV20) 05/22/2022   INFLUENZA VACCINE  07/07/2022   MAMMOGRAM  10/21/2022   TETANUS/TDAP  02/19/2024   COLONOSCOPY (Pts 45-65yr Insurance coverage will need to be confirmed)  12/17/2031   DEXA SCAN  Completed   Hepatitis C Screening  Completed   HPV VACCINES  Aged Out   COVID-19 Vaccine  Discontinued    Health Maintenance  Health Maintenance Due  Topic Date Due   Zoster Vaccines- Shingrix (2 of 2) 03/13/2022   Pneumonia Vaccine 73 Years old (2 -  PPSV23 if available, else PCV20) 05/22/2022    Colorectal cancer screening: Type of screening: Colonoscopy. Completed 12/16/2021. Repeat every 10 years  Mammogram status: Completed 10/21/2022.  Repeat every year  Bone Density status: Completed 01/15/2021. Results reflect: Bone density results: OSTEOPENIA. Repeat every 5 years.  Lung Cancer Screening: (Low Dose CT Chest recommended if Age 26-80 years, 30 pack-year currently smoking OR have quit w/in 15years.) does not qualify.   Lung Cancer Screening Referral: n/a  Additional Screening:  Hepatitis C Screening: does not qualify;   Vision Screening: Recommended annual ophthalmology exams for early detection of glaucoma and other disorders of the eye. Is the patient up to date with their annual eye exam?  Yes  Who is the provider or what is the name of the office in which the patient attends annual eye exams? Dr.Bowman  If pt is not established with a provider, would they like to be referred to a provider to establish care? No .   Dental Screening: Recommended annual dental exams for proper oral hygiene  Community Resource Referral / Chronic Care Management: CRR required this visit?  No   CCM required this visit?  No      Plan:     I have personally reviewed and noted the following in the patient's chart:   Medical and social history Use of alcohol, tobacco or illicit drugs  Current medications and supplements including opioid prescriptions.  Functional ability and status Nutritional status Physical activity Advanced directives List of other physicians Hospitalizations, surgeries, and ER visits in previous 12 months Vitals Screenings to include cognitive, depression, and falls Referrals and appointments  In addition, I have reviewed and discussed with patient certain preventive protocols, quality metrics, and best practice recommendations. A written personalized care plan for preventive services as well as general preventive health recommendations were provided to patient.     Randel Pigg, LPN   06/16/6578   Nurse Notes: none

## 2022-04-30 ENCOUNTER — Ambulatory Visit (INDEPENDENT_AMBULATORY_CARE_PROVIDER_SITE_OTHER): Payer: PPO

## 2022-04-30 DIAGNOSIS — M810 Age-related osteoporosis without current pathological fracture: Secondary | ICD-10-CM

## 2022-04-30 MED ORDER — DENOSUMAB 60 MG/ML ~~LOC~~ SOSY
60.0000 mg | PREFILLED_SYRINGE | Freq: Once | SUBCUTANEOUS | Status: AC
  Administered 2022-04-30: 60 mg via SUBCUTANEOUS

## 2022-04-30 NOTE — Progress Notes (Signed)
After obtaining consent, and per orders of Dr. Ethelene Hal, injection of Prolia given by Lynda Rainwater. Patient instructed to remain in clinic for 20 minutes afterwards, and to report any adverse reaction to me immediately.

## 2022-05-06 ENCOUNTER — Ambulatory Visit
Admission: RE | Admit: 2022-05-06 | Discharge: 2022-05-06 | Disposition: A | Discharge status: Home / Self Care | Payer: No Typology Code available for payment source | Source: Ambulatory Visit | Attending: Family Medicine | Admitting: Family Medicine

## 2022-05-06 DIAGNOSIS — I700 Atherosclerosis of aorta: Secondary | ICD-10-CM

## 2022-05-06 DIAGNOSIS — Z9189 Other specified personal risk factors, not elsewhere classified: Secondary | ICD-10-CM

## 2022-05-12 DIAGNOSIS — Z961 Presence of intraocular lens: Secondary | ICD-10-CM | POA: Diagnosis not present

## 2022-06-30 ENCOUNTER — Telehealth (INDEPENDENT_AMBULATORY_CARE_PROVIDER_SITE_OTHER): Payer: PPO | Admitting: Family Medicine

## 2022-06-30 DIAGNOSIS — U071 COVID-19: Secondary | ICD-10-CM | POA: Diagnosis not present

## 2022-06-30 MED ORDER — MOLNUPIRAVIR EUA 200MG CAPSULE: 4.0000 | ORAL_CAPSULE | Freq: Two times a day (BID) | ORAL | 0 refills | Status: AC

## 2022-06-30 NOTE — Progress Notes (Signed)
Virtual Visit via Video Note  I connected with Virginia Fox on 06/30/22 at  4:20 PM EDT by a video enabled telemedicine application and verified that I am speaking with the correct person using two identifiers.  Location: Patient: Home Provider: Office   I discussed the limitations of evaluation and management by telemedicine and the availability of in person appointments. The patient expressed understanding and agreed to proceed.  History of Present Illness: Upper respiratory symptoms She complains of congestion and low grade fever.with chills. Onset of symptoms was 3 days ago and staying constant.She is drinking plenty of fluids.  Past history is significant for no history of pneumonia or bronchitis. Patient is non-smoker.  Tested positive COVID at home. No known sick exposure.   Tried mucinex for cough and tylenol for chills.   Patient had covid vaccine x3, not boosted in fall.    Observations/Objective:  Gen: NAD, resting comfortably HEENT: EOMI Pulm: NWOB Skin: no rash on faceNeuro: grossly normal, moves all extremities Neuro: no facial asymmetry or dysmetria Psych: Normal affect  Assessment and Plan:  COVID-19 Testing positive at home  Recommend continue supportive care with OTC remedies Postnasal drip: Intranasal steroids, like Flonase antihistamines, like Zyrtec or Astelin cough: Dextromethorphan, guaifenesin,  Aches and pains: Tylenol or ibuprofen Prescriptions to assist with symptom management:Start molnupiravir 800 mg twice daily x5 days Quarantine and masking precautions discussed Return precaution and emergency precautions discussed   Follow Up Instructions:    I discussed the assessment and treatment plan with the patient. The patient was provided an opportunity to ask questions and all were answered. The patient agreed with the plan and demonstrated an understanding of the instructions.   The patient was advised to call back or seek an in-person  evaluation if the symptoms worsen or if the condition fails to improve as anticipated.  I provided 15 minutes of non-face-to-face time during this encounter.   Bonnita Hollow, MD

## 2022-06-30 NOTE — Patient Instructions (Signed)
Take molnupiravir as prescribed Follow up if no improvement Quarnetine as discussed Go to ED if you get severe symptoms of chest pain or shortness of breath

## 2022-07-09 ENCOUNTER — Ambulatory Visit (INDEPENDENT_AMBULATORY_CARE_PROVIDER_SITE_OTHER): Payer: PPO | Admitting: Family Medicine

## 2022-07-09 ENCOUNTER — Encounter: Payer: Self-pay | Admitting: Family Medicine

## 2022-07-09 VITALS — BP 122/66 | HR 81 | Temp 98.1°F | Ht 65.0 in | Wt 126.4 lb

## 2022-07-09 DIAGNOSIS — M791 Myalgia, unspecified site: Secondary | ICD-10-CM

## 2022-07-09 DIAGNOSIS — T452X1D Poisoning by vitamins, accidental (unintentional), subsequent encounter: Secondary | ICD-10-CM

## 2022-07-09 DIAGNOSIS — T466X5A Adverse effect of antihyperlipidemic and antiarteriosclerotic drugs, initial encounter: Secondary | ICD-10-CM

## 2022-07-09 DIAGNOSIS — I700 Atherosclerosis of aorta: Secondary | ICD-10-CM

## 2022-07-09 DIAGNOSIS — T452X1A Poisoning by vitamins, accidental (unintentional), initial encounter: Secondary | ICD-10-CM | POA: Insufficient documentation

## 2022-07-09 LAB — VITAMIN D 25 HYDROXY (VIT D DEFICIENCY, FRACTURES): VITD: 59.88 ng/mL (ref 30.00–100.00)

## 2022-07-09 NOTE — Progress Notes (Signed)
Established Patient Office Visit  Subjective   Patient ID: Virginia Fox, female    DOB: 1951/05/20  Age: 71 y.o. MRN: [id number]  Chief Complaint  Patient presents with   Follow-up    3 month follow up, still unable to take Pravastatin.     HPI follow-up of statin therapy with pravastatin.  Unable to tolerate this drug as well.  She developed severe cramping in her calf muscles and discontinued it.  Cramping subsided.  She has an intermediate risk score for vascular disease predominantly due to age.  HDL cholesterol 76.  Coronary calcium score of 0.  Vitamin D was found to be elevated.  She has not taken vitamin D in many months.    Review of Systems  Constitutional:  Negative for chills, diaphoresis, malaise/fatigue and weight loss.  HENT: Negative.    Eyes: Negative.  Negative for blurred vision and double vision.  Cardiovascular:  Negative for chest pain.  Gastrointestinal:  Negative for abdominal pain.  Genitourinary: Negative.   Musculoskeletal:  Negative for falls and myalgias.  Neurological:  Negative for speech change, loss of consciousness and weakness.  Psychiatric/Behavioral: Negative.        Objective:     BP 122/66 (BP Location: Right Arm, Patient Position: Sitting, Cuff Size: Normal)   Pulse 81   Temp 98.1 F (36.7 C) (Temporal)   Ht 5' 5"  (1.651 m)   Wt 126 lb 6.4 oz (57.3 kg)   SpO2 95%   BMI 21.03 kg/m    Physical Exam Constitutional:      General: She is not in acute distress.    Appearance: Normal appearance. She is not ill-appearing, toxic-appearing or diaphoretic.  HENT:     Head: Normocephalic and atraumatic.     Right Ear: External ear normal.     Left Ear: External ear normal.     Mouth/Throat:     Mouth: Mucous membranes are moist.     Pharynx: Oropharynx is clear. No oropharyngeal exudate or posterior oropharyngeal erythema.  Eyes:     General: No scleral icterus.       Right eye: No discharge.        Left eye: No discharge.      Extraocular Movements: Extraocular movements intact.     Conjunctiva/sclera: Conjunctivae normal.     Pupils: Pupils are equal, round, and reactive to light.  Cardiovascular:     Rate and Rhythm: Normal rate and regular rhythm.  Pulmonary:     Effort: Pulmonary effort is normal. No respiratory distress.     Breath sounds: Normal breath sounds.  Abdominal:     General: Bowel sounds are normal.     Tenderness: There is no abdominal tenderness. There is no guarding.  Musculoskeletal:     Cervical back: No rigidity or tenderness.  Skin:    General: Skin is warm and dry.  Neurological:     Mental Status: She is alert and oriented to person, place, and time.  Psychiatric:        Mood and Affect: Mood normal.        Behavior: Behavior normal.      No results found for any visits on 07/09/22.    The 10-year ASCVD risk score (Arnett DK, et al., 2019) is: 10.5%    Assessment & Plan:   Problem List Items Addressed This Visit       Cardiovascular and Mediastinum   Atherosclerosis of aorta (HCC)     Other   Myalgia  due to statin   Vitamin D overdose - Primary   Relevant Orders   VITAMIN D 25 Hydroxy (Vit-D Deficiency, Fractures)    Return in about 1 year (around 07/10/2023), or if symptoms worsen or fail to improve.  With shared decision making, neither I or the patient believe that she needs a statin.  Rechecking vitamin D.  Libby Maw, MD

## 2022-07-28 DIAGNOSIS — H01002 Unspecified blepharitis right lower eyelid: Secondary | ICD-10-CM | POA: Diagnosis not present

## 2022-07-28 DIAGNOSIS — H01005 Unspecified blepharitis left lower eyelid: Secondary | ICD-10-CM | POA: Diagnosis not present

## 2022-07-28 DIAGNOSIS — H18593 Other hereditary corneal dystrophies, bilateral: Secondary | ICD-10-CM | POA: Diagnosis not present

## 2022-08-04 DIAGNOSIS — L578 Other skin changes due to chronic exposure to nonionizing radiation: Secondary | ICD-10-CM | POA: Diagnosis not present

## 2022-08-04 DIAGNOSIS — L820 Inflamed seborrheic keratosis: Secondary | ICD-10-CM | POA: Diagnosis not present

## 2022-08-04 DIAGNOSIS — D2371 Other benign neoplasm of skin of right lower limb, including hip: Secondary | ICD-10-CM | POA: Diagnosis not present

## 2022-08-04 DIAGNOSIS — Z85828 Personal history of other malignant neoplasm of skin: Secondary | ICD-10-CM | POA: Diagnosis not present

## 2022-08-04 DIAGNOSIS — L570 Actinic keratosis: Secondary | ICD-10-CM | POA: Diagnosis not present

## 2022-08-04 DIAGNOSIS — D485 Neoplasm of uncertain behavior of skin: Secondary | ICD-10-CM | POA: Diagnosis not present

## 2022-08-04 DIAGNOSIS — L814 Other melanin hyperpigmentation: Secondary | ICD-10-CM | POA: Diagnosis not present

## 2022-08-28 ENCOUNTER — Encounter: Payer: Self-pay | Admitting: Family Medicine

## 2022-08-28 ENCOUNTER — Ambulatory Visit (INDEPENDENT_AMBULATORY_CARE_PROVIDER_SITE_OTHER): Payer: PPO

## 2022-08-28 DIAGNOSIS — Z23 Encounter for immunization: Secondary | ICD-10-CM

## 2022-08-28 NOTE — Progress Notes (Signed)
Per orders of Dr. Ethelene Hal, pt is here for HIGH DOSE FLU VACCINE. pt received VACCINE in LEFT DELTOID at 10:23AM. Given by Marcy Salvo . Pt tolerated VACCINE well.

## 2022-09-21 ENCOUNTER — Other Ambulatory Visit: Payer: Self-pay | Admitting: Family Medicine

## 2022-09-21 DIAGNOSIS — Z1231 Encounter for screening mammogram for malignant neoplasm of breast: Secondary | ICD-10-CM

## 2022-10-08 ENCOUNTER — Telehealth: Payer: Self-pay

## 2022-10-08 DIAGNOSIS — M810 Age-related osteoporosis without current pathological fracture: Secondary | ICD-10-CM

## 2022-11-02 DIAGNOSIS — H903 Sensorineural hearing loss, bilateral: Secondary | ICD-10-CM | POA: Diagnosis not present

## 2022-11-02 NOTE — Telephone Encounter (Signed)
Pt cancel her appt for 11.29.23 but she want to reschedule but before she do pt would like for you to call her

## 2022-11-03 ENCOUNTER — Ambulatory Visit
Admission: RE | Admit: 2022-11-03 | Discharge: 2022-11-03 | Disposition: A | Discharge status: Home / Self Care | Payer: PPO | Source: Ambulatory Visit | Attending: Family Medicine | Admitting: Family Medicine

## 2022-11-03 DIAGNOSIS — Z1231 Encounter for screening mammogram for malignant neoplasm of breast: Secondary | ICD-10-CM | POA: Diagnosis not present

## 2022-11-04 ENCOUNTER — Ambulatory Visit: Payer: PPO

## 2022-11-04 ENCOUNTER — Other Ambulatory Visit (HOSPITAL_COMMUNITY): Payer: Self-pay

## 2022-11-04 MED ORDER — DENOSUMAB 60 MG/ML ~~LOC~~ SOSY
60.0000 mg | PREFILLED_SYRINGE | Freq: Once | SUBCUTANEOUS | 0 refills | Status: AC
  Filled 2022-11-04: qty 1, 1d supply, fill #0
  Filled 2022-11-05: qty 1, 180d supply, fill #0

## 2022-11-04 NOTE — Telephone Encounter (Signed)
Pt ready for scheduling on or after 11/04/22  Out-of-pocket cost due at time of visit: $301  Primary: Medicare Prolia co-insurance: 20% (approximately $301) Admin fee co-insurance: 0%  Secondary:  Prolia co-insurance:  Admin fee co-insurance:   Deductible:   Prior Auth:  PA# Valid:   ** This summary of benefits is an estimation of the patient's out-of-pocket cost. Exact cost may vary based on individual plan coverage.

## 2022-11-04 NOTE — Telephone Encounter (Signed)
Rx sent to pharmacy to be filled an delivered. Patient aware appointment rescheduled from 11/05/22 to 11/10/22 patient agrees to pay office administration fee of $25 and pay $200 to [facility] for price of injection.

## 2022-11-04 NOTE — Telephone Encounter (Signed)
Spoke with patient went over Prolia informed patient that no PA was needed for injection there is a co-pay of $315 patient agrees to pay at time of visit. Appointment scheduled.

## 2022-11-04 NOTE — Telephone Encounter (Signed)
Pharmacy Patient Advocate Encounter  Insurance verification completed.    The patient is insured through [employer] test claims for: Prolia 87m.  Pharmacy benefit copay: $200.00

## 2022-11-04 NOTE — Addendum Note (Signed)
Addended by: Lynda Rainwater on: 11/04/2022 04:13 PM   Modules accepted: Orders

## 2022-11-05 ENCOUNTER — Other Ambulatory Visit (HOSPITAL_COMMUNITY): Payer: Self-pay

## 2022-11-05 ENCOUNTER — Ambulatory Visit: Payer: PPO

## 2022-11-10 ENCOUNTER — Ambulatory Visit: Payer: PPO

## 2022-11-10 DIAGNOSIS — M810 Age-related osteoporosis without current pathological fracture: Secondary | ICD-10-CM | POA: Diagnosis not present

## 2022-11-10 MED ORDER — DENOSUMAB 60 MG/ML ~~LOC~~ SOSY
60.0000 mg | PREFILLED_SYRINGE | Freq: Once | SUBCUTANEOUS | Status: AC
  Administered 2022-11-10: 60 mg via SUBCUTANEOUS

## 2022-11-10 NOTE — Progress Notes (Signed)
After obtaining consent, and per orders of Dr. Ethelene Hal, injection of Prolia given by Lynda Rainwater. Patient instructed to remain in clinic for 20 minutes afterwards, and to report any adverse reaction to me immediately.

## 2022-11-12 ENCOUNTER — Ambulatory Visit: Payer: PPO

## 2022-12-07 DIAGNOSIS — I4891 Unspecified atrial fibrillation: Secondary | ICD-10-CM

## 2022-12-07 HISTORY — DX: Unspecified atrial fibrillation: I48.91

## 2023-03-18 ENCOUNTER — Telehealth: Payer: Self-pay | Admitting: Family Medicine

## 2023-03-18 NOTE — Telephone Encounter (Signed)
Contacted Shaketa K Dastrup to schedule their annual wellness visit. Appointment made for 03/22/23.  Rudell Cobb AWV direct phone # [phone]

## 2023-03-22 ENCOUNTER — Ambulatory Visit (INDEPENDENT_AMBULATORY_CARE_PROVIDER_SITE_OTHER): Payer: PPO

## 2023-03-22 VITALS — Ht 65.0 in | Wt 125.0 lb

## 2023-03-22 DIAGNOSIS — Z0000 Encounter for general adult medical examination without abnormal findings: Secondary | ICD-10-CM

## 2023-03-22 NOTE — Patient Instructions (Signed)
Virginia Fox , Thank you for taking time to come for your Medicare Wellness Visit. I appreciate your ongoing commitment to your health goals. Please review the following plan we discussed and let me know if I can assist you in the future.   These are the goals we discussed:  Goals      Patient Stated     Maintain current healthy active lifestyle     Patient Stated     03/22/2023, no goals        This is a list of the screening recommended for you and due dates:  Health Maintenance  Topic Date Due   Flu Shot  07/08/2023   Mammogram  11/04/2023   DTaP/Tdap/Td vaccine (2 - Td or Tdap) 02/19/2024   Medicare Annual Wellness Visit  03/21/2024   Colon Cancer Screening  12/17/2031   DEXA scan (bone density measurement)  Completed   Hepatitis C Screening: USPSTF Recommendation to screen - Ages 15-79 yo.  Completed   Zoster (Shingles) Vaccine  Completed   HPV Vaccine  Aged Out   Pneumonia Vaccine  Discontinued   COVID-19 Vaccine  Discontinued    Advanced directives: Please bring a copy of your POA (Power of Attorney) and/or Living Will to your next appointment.   Conditions/risks identified: none  Next appointment: Follow up in one year for your annual wellness visit    Preventive Care 65 Years and Older, Female Preventive care refers to lifestyle choices and visits with your health care provider that can promote health and wellness. What does preventive care include? A yearly physical exam. This is also called an annual well check. Dental exams once or twice a year. Routine eye exams. Ask your health care provider how often you should have your eyes checked. Personal lifestyle choices, including: Daily care of your teeth and gums. Regular physical activity. Eating a healthy diet. Avoiding tobacco and drug use. Limiting alcohol use. Practicing safe sex. Taking low-dose aspirin every day. Taking vitamin and mineral supplements as recommended by your health care  provider. What happens during an annual well check? The services and screenings done by your health care provider during your annual well check will depend on your age, overall health, lifestyle risk factors, and family history of disease. Counseling  Your health care provider may ask you questions about your: Alcohol use. Tobacco use. Drug use. Emotional well-being. Home and relationship well-being. Sexual activity. Eating habits. History of falls. Memory and ability to understand (cognition). Work and work Astronomer. Reproductive health. Screening  You may have the following tests or measurements: Height, weight, and BMI. Blood pressure. Lipid and cholesterol levels. These may be checked every 5 years, or more frequently if you are over 32 years old. Skin check. Lung cancer screening. You may have this screening every year starting at age 18 if you have a 30-pack-year history of smoking and currently smoke or have quit within the past 15 years. Fecal occult blood test (FOBT) of the stool. You may have this test every year starting at age 35. Flexible sigmoidoscopy or colonoscopy. You may have a sigmoidoscopy every 5 years or a colonoscopy every 10 years starting at age 75. Hepatitis C blood test. Hepatitis B blood test. Sexually transmitted disease (STD) testing. Diabetes screening. This is done by checking your blood sugar (glucose) after you have not eaten for a while (fasting). You may have this done every 1-3 years. Bone density scan. This is done to screen for osteoporosis. You may have this done  starting at age 42. Mammogram. This may be done every 1-2 years. Talk to your health care provider about how often you should have regular mammograms. Talk with your health care provider about your test results, treatment options, and if necessary, the need for more tests. Vaccines  Your health care provider may recommend certain vaccines, such as: Influenza vaccine. This is  recommended every year. Tetanus, diphtheria, and acellular pertussis (Tdap, Td) vaccine. You may need a Td booster every 10 years. Zoster vaccine. You may need this after age 44. Pneumococcal 13-valent conjugate (PCV13) vaccine. One dose is recommended after age 56. Pneumococcal polysaccharide (PPSV23) vaccine. One dose is recommended after age 51. Talk to your health care provider about which screenings and vaccines you need and how often you need them. This information is not intended to replace advice given to you by your health care provider. Make sure you discuss any questions you have with your health care provider. Document Released: 12/20/2015 Document Revised: 08/12/2016 Document Reviewed: 09/24/2015 Elsevier Interactive Patient Education  2017 [employer] Prevention in the Home Falls can cause injuries. They can happen to people of all ages. There are many things you can do to make your home safe and to help prevent falls. What can I do on the outside of my home? Regularly fix the edges of walkways and driveways and fix any cracks. Remove anything that might make you trip as you walk through a door, such as a raised step or threshold. Trim any bushes or trees on the path to your home. Use bright outdoor lighting. Clear any walking paths of anything that might make someone trip, such as rocks or tools. Regularly check to see if handrails are loose or broken. Make sure that both sides of any steps have handrails. Any raised decks and porches should have guardrails on the edges. Have any leaves, snow, or ice cleared regularly. Use sand or salt on walking paths during winter. Clean up any spills in your garage right away. This includes oil or grease spills. What can I do in the bathroom? Use night lights. Install grab bars by the toilet and in the tub and shower. Do not use towel bars as grab bars. Use non-skid mats or decals in the tub or shower. If you need to sit down in  the shower, use a plastic, non-slip stool. Keep the floor dry. Clean up any water that spills on the floor as soon as it happens. Remove soap buildup in the tub or shower regularly. Attach bath mats securely with double-sided non-slip rug tape. Do not have throw rugs and other things on the floor that can make you trip. What can I do in the bedroom? Use night lights. Make sure that you have a light by your bed that is easy to reach. Do not use any sheets or blankets that are too big for your bed. They should not hang down onto the floor. Have a firm chair that has side arms. You can use this for support while you get dressed. Do not have throw rugs and other things on the floor that can make you trip. What can I do in the kitchen? Clean up any spills right away. Avoid walking on wet floors. Keep items that you use a lot in easy-to-reach places. If you need to reach something above you, use a strong step stool that has a grab bar. Keep electrical cords out of the way. Do not use floor polish or wax that  makes floors slippery. If you must use wax, use non-skid floor wax. Do not have throw rugs and other things on the floor that can make you trip. What can I do with my stairs? Do not leave any items on the stairs. Make sure that there are handrails on both sides of the stairs and use them. Fix handrails that are broken or loose. Make sure that handrails are as long as the stairways. Check any carpeting to make sure that it is firmly attached to the stairs. Fix any carpet that is loose or worn. Avoid having throw rugs at the top or bottom of the stairs. If you do have throw rugs, attach them to the floor with carpet tape. Make sure that you have a light switch at the top of the stairs and the bottom of the stairs. If you do not have them, ask someone to add them for you. What else can I do to help prevent falls? Wear shoes that: Do not have high heels. Have rubber bottoms. Are comfortable  and fit you well. Are closed at the toe. Do not wear sandals. If you use a stepladder: Make sure that it is fully opened. Do not climb a closed stepladder. Make sure that both sides of the stepladder are locked into place. Ask someone to hold it for you, if possible. Clearly mark and make sure that you can see: Any grab bars or handrails. First and last steps. Where the edge of each step is. Use tools that help you move around (mobility aids) if they are needed. These include: Canes. Walkers. Scooters. Crutches. Turn on the lights when you go into a dark area. Replace any light bulbs as soon as they burn out. Set up your furniture so you have a clear path. Avoid moving your furniture around. If any of your floors are uneven, fix them. If there are any pets around you, be aware of where they are. Review your medicines with your doctor. Some medicines can make you feel dizzy. This can increase your chance of falling. Ask your doctor what other things that you can do to help prevent falls. This information is not intended to replace advice given to you by your health care provider. Make sure you discuss any questions you have with your health care provider. Document Released: 09/19/2009 Document Revised: 04/30/2016 Document Reviewed: 12/28/2014 Elsevier Interactive Patient Education  2017 [employer].

## 2023-03-22 NOTE — Progress Notes (Signed)
I connected with  Bailie K Rohr on 03/22/23 by a audio enabled telemedicine application and verified that I am speaking with the correct person using two identifiers.  Patient Location: Home  Provider Location: Office/Clinic  I discussed the limitations of evaluation and management by telemedicine. The patient expressed understanding and agreed to proceed.  Subjective:   Virginia Fox is a 72 y.o. female who presents for Medicare Annual (Subsequent) preventive examination.  Patient Medicare AWV questionnaire was completed by the patient on 03/22/2023; I have confirmed that all information answered by patient is correct and no changes since this date.     Review of Systems     Cardiac Risk Factors include: advanced age (>97men, >74 women)     Objective:    Today's Vitals   03/22/23 1356  Weight: 125 lb (56.7 kg)  Height: 5\' 5"  (1.651 m)   Body mass index is 20.8 kg/m.     03/22/2023    1:59 PM 04/22/2022   12:55 PM 08/29/2021    6:14 PM 03/25/2021   12:47 PM 01/21/2017    2:13 PM  Advanced Directives  Does Patient Have a Medical Advance Directive? Yes Yes Yes Yes Yes  Type of Estate agent of [employer] Power of [place];Living will Healthcare Power of [employer] of [place];Living will Living will  Does patient want to make changes to medical advance directive?   No - Patient declined    Copy of Healthcare Power of Attorney in Chart? No - copy requested No - copy requested No - copy requested No - copy requested     Current Medications (verified) Outpatient Encounter Medications as of 03/22/2023  Medication Sig   Azelaic Acid 15 % gel SMARTSIG:1 sparingly Topical Daily   Loratadine 10 MG CAPS    Multiple Vitamins-Minerals (MULTIVITAMIN WITH MINERALS) tablet Take 1 tablet by mouth daily.   Polyethyl Glyc-Propyl Glyc PF (SYSTANE ULTRA PF) 0.4-0.3 % SOLN    pravastatin (PRAVACHOL) 20 MG tablet Take 1 tablet (20 mg total)  by mouth at bedtime. (Patient not taking: Reported on 07/09/2022)   No facility-administered encounter medications on file as of 03/22/2023.    Allergies (verified) Atorvastatin   History: Past Medical History:  Diagnosis Date   Allergy    Anal fissure    Atherosclerosis of aorta    seem on CT scan   Cataract    bilateral cataracts removed   GERD (gastroesophageal reflux disease)    Heart murmur    Hiatal hernia    Internal hemorrhoids    Osteoporosis    Past Surgical History:  Procedure Laterality Date   AUGMENTATION MAMMAPLASTY Bilateral    BREAST ENHANCEMENT SURGERY     CESAREAN SECTION     x 2   COLONOSCOPY     WISDOM TOOTH EXTRACTION     Family History  Problem Relation Age of Onset   Multiple sclerosis Mother    Diabetes Father    Hypertension Father    Colon cancer Neg Hx    Esophageal cancer Neg Hx    Pancreatic cancer Neg Hx    Stomach cancer Neg Hx    Liver disease Neg Hx    Rectal cancer Neg Hx    Social History   Socioeconomic History   Marital status: Widowed    Spouse name: Not on file   Number of children: Not on file   Years of education: Not on file   Highest education level: Not on file  Occupational History   Occupation: Retired  Tobacco Use   Smoking status: Never    Passive exposure: Never   Smokeless tobacco: Never  Vaping Use   Vaping Use: Never used  Substance and Sexual Activity   Alcohol use: Yes    Comment: socially   Drug use: No   Sexual activity: Not Currently    Birth control/protection: Post-menopausal  Other Topics Concern   Not on file  Social History Narrative   Not on file   Social Determinants of Health   Financial Resource Strain: Low Risk  (03/22/2023)   Overall Financial Resource Strain (CARDIA)    Difficulty of Paying Living Expenses: Not hard at all  Food Insecurity: No Food Insecurity (03/22/2023)   Hunger Vital Sign    Worried About Running Out of Food in the Last Year: Never true    Ran Out of Food  in the Last Year: Never true  Transportation Needs: No Transportation Needs (03/22/2023)   PRAPARE - Administrator, Civil Service (Medical): No    Lack of Transportation (Non-Medical): No  Physical Activity: Sufficiently Active (03/22/2023)   Exercise Vital Sign    Days of Exercise per Week: 5 days    Minutes of Exercise per Session: 40 min  Stress: No Stress Concern Present (03/22/2023)   [employer] of Occupational Health - Occupational Stress Questionnaire    Feeling of Stress : Not at all  Social Connections: Moderately Isolated (03/22/2023)   Social Connection and Isolation Panel [NHANES]    Frequency of Communication with Friends and Family: More than three times a week    Frequency of Social Gatherings with Friends and Family: More than three times a week    Attends Religious Services: Never    Database administrator or Organizations: Yes    Attends Banker Meetings: 1 to 4 times per year    Marital Status: Widowed    Tobacco Counseling Counseling given: Not Answered   Clinical Intake:  Pre-visit preparation completed: Yes  Pain : No/denies pain     Nutritional Status: BMI of 19-24  Normal Nutritional Risks: None Diabetes: No  How often do you need to have someone help you when you read instructions, pamphlets, or other written materials from your doctor or pharmacy?: 1 - Never  Diabetic? no  Interpreter Needed?: No  Information entered by :: NAllen LPN   Activities of Daily Living    03/22/2023    8:04 AM 04/22/2022   12:58 PM  In your present state of health, do you have any difficulty performing the following activities:  Hearing? 0 0  Vision? 0 0  Difficulty concentrating or making decisions? 0 0  Walking or climbing stairs? 0 0  Dressing or bathing? 0 0  Doing errands, shopping? 0 0  Preparing Food and eating ? N N  Using the Toilet? N N  In the past six months, have you accidently leaked urine? N N  Do you have  problems with loss of bowel control? N N  Managing your Medications? N N  Managing your Finances? N N  Housekeeping or managing your Housekeeping? N N    Patient Care Team: Mliss Sax, MD as PCP - General (Family Medicine) Sinda Du, MD as Consulting Physician (Ophthalmology)  Indicate any recent Medical Services you may have received from other than [facility] providers in the past year (date may be approximate).     Assessment:   This is a routine wellness  examination for Virginia Fox.  Hearing/Vision screen Vision Screening - Comments:: Regular eye exams, [facility]  Dietary issues and exercise activities discussed: Current Exercise Habits: Structured exercise class, Type of exercise: walking, Time (Minutes): 40, Frequency (Times/Week): 5, Weekly Exercise (Minutes/Week): 200   Goals Addressed             This Visit's Progress    Patient Stated       03/22/2023, no goals       Depression Screen    03/22/2023    2:00 PM 07/09/2022    1:05 PM 04/22/2022   12:56 PM 04/22/2022   12:54 PM 04/07/2022    1:09 PM 12/23/2021    1:01 PM 03/25/2021   12:49 PM  PHQ 2/9 Scores  PHQ - 2 Score 0 0 0 0 0 0 0    Fall Risk    03/22/2023    8:04 AM 07/09/2022    1:05 PM 04/22/2022   12:56 PM 04/07/2022    1:09 PM 12/23/2021    1:01 PM  Fall Risk   Falls in the past year? 0 0 0 0 0  Number falls in past yr: 0 0 0 0 0  Injury with Fall? 0  0    Risk for fall due to : Medication side effect      Follow up Falls prevention discussed;Education provided;Falls evaluation completed  Falls evaluation completed      FALL RISK PREVENTION PERTAINING TO THE HOME:  Any stairs in or around the home? No  If so, are there any without handrails? N/a Home free of loose throw rugs in walkways, pet beds, electrical cords, etc? Yes  Adequate lighting in your home to reduce risk of falls? Yes   ASSISTIVE DEVICES UTILIZED TO PREVENT FALLS:  Life alert? No  Use of a cane, walker or w/c? No   Grab bars in the bathroom? Yes  Shower chair or bench in shower? Yes  Elevated toilet seat or a handicapped toilet? Yes   TIMED UP AND GO:  Was the test performed? No .      Cognitive Function:        03/22/2023    2:01 PM  6CIT Screen  What Year? 0 points  What month? 0 points  What time? 0 points  Count back from 20 0 points  Months in reverse 0 points  Repeat phrase 0 points  Total Score 0 points    Immunizations Immunization History  Administered Date(s) Administered   Fluad Quad(high Dose 65+) 12/18/2020, 09/17/2021, 08/28/2022   Influenza Split 01/05/2017   Influenza, High Dose Seasonal PF 09/27/2019   Influenza,inj,Quad PF,6+ Mos 01/05/2017, 09/23/2017, 10/14/2018   PFIZER(Purple Top)SARS-COV-2 Vaccination 01/14/2020, 02/08/2020, 09/19/2020   Pneumococcal Conjugate-13 05/22/2021   Tdap 02/18/2014   Zoster Recombinat (Shingrix) 01/16/2022, 05/13/2022    TDAP status: Up to date  Flu Vaccine status: Up to date  Pneumococcal vaccine status: Up to date  Covid-19 vaccine status: Completed vaccines  Qualifies for Shingles Vaccine? Yes   Zostavax completed Yes   Shingrix Completed?: Yes  Screening Tests Health Maintenance  Topic Date Due   Medicare Annual Wellness (AWV)  04/23/2023   INFLUENZA VACCINE  07/08/2023   MAMMOGRAM  11/04/2023   DTaP/Tdap/Td (2 - Td or Tdap) 02/19/2024   COLONOSCOPY (Pts 45-57yrs Insurance coverage will need to be confirmed)  12/17/2031   DEXA SCAN  Completed   Hepatitis C Screening  Completed   Zoster Vaccines- Shingrix  Completed   HPV VACCINES  Aged Out   Pneumonia Vaccine 72+ Years old  Discontinued   COVID-19 Vaccine  Discontinued    Health Maintenance  Health Maintenance Due  Topic Date Due   Medicare Annual Wellness (AWV)  04/23/2023    Colorectal cancer screening: No longer required.   Mammogram status: Completed 11/03/2022. Repeat every year  Bone Density status: Completed 01/15/2021.   Lung Cancer  Screening: (Low Dose CT Chest recommended if Age 98-80 years, 30 pack-year currently smoking OR have quit w/in 15years.) does not qualify.   Lung Cancer Screening Referral: no  Additional Screening:  Hepatitis C Screening: does qualify; Completed 05/22/2021  Vision Screening: Recommended annual ophthalmology exams for early detection of glaucoma and other disorders of the eye. Is the patient up to date with their annual eye exam?  Yes  Who is the provider or what is the name of the office in which the patient attends annual eye exams? [facility] If pt is not established with a provider, would they like to be referred to a provider to establish care? No .   Dental Screening: Recommended annual dental exams for proper oral hygiene  Community Resource Referral / Chronic Care Management: CRR required this visit?  No   CCM required this visit?  No      Plan:     I have personally reviewed and noted the following in the patient's chart:   Medical and social history Use of alcohol, tobacco or illicit drugs  Current medications and supplements including opioid prescriptions. Patient is not currently taking opioid prescriptions. Functional ability and status Nutritional status Physical activity Advanced directives List of other physicians Hospitalizations, surgeries, and ER visits in previous 12 months Vitals Screenings to include cognitive, depression, and falls Referrals and appointments  In addition, I have reviewed and discussed with patient certain preventive protocols, quality metrics, and best practice recommendations. A written personalized care plan for preventive services as well as general preventive health recommendations were provided to patient.     Barb Merino, LPN   7/51/7001   Nurse Notes: none  Due to this being a virtual visit, the after visit summary with patients personalized plan was offered to patient via mail or my-chart. Patient would like to  access on my-chart

## 2023-03-23 DIAGNOSIS — H903 Sensorineural hearing loss, bilateral: Secondary | ICD-10-CM | POA: Diagnosis not present

## 2023-04-20 ENCOUNTER — Other Ambulatory Visit: Payer: Self-pay

## 2023-04-21 ENCOUNTER — Telehealth: Payer: Self-pay | Admitting: Family Medicine

## 2023-04-21 DIAGNOSIS — H903 Sensorineural hearing loss, bilateral: Secondary | ICD-10-CM | POA: Diagnosis not present

## 2023-04-21 NOTE — Telephone Encounter (Signed)
Returned call no answer LMTCB 

## 2023-04-21 NOTE — Telephone Encounter (Signed)
Pt stated she has not heard from anyone about her prolia shot. Please call.

## 2023-04-22 ENCOUNTER — Other Ambulatory Visit (HOSPITAL_COMMUNITY): Payer: Self-pay

## 2023-04-29 ENCOUNTER — Other Ambulatory Visit: Payer: Self-pay

## 2023-04-29 ENCOUNTER — Other Ambulatory Visit (HOSPITAL_COMMUNITY): Payer: Self-pay

## 2023-04-29 DIAGNOSIS — M810 Age-related osteoporosis without current pathological fracture: Secondary | ICD-10-CM

## 2023-04-29 MED ORDER — DENOSUMAB 60 MG/ML ~~LOC~~ SOSY
60.0000 mg | PREFILLED_SYRINGE | SUBCUTANEOUS | 0 refills | Status: DC
  Filled 2023-04-29: qty 180, fill #0
  Filled 2023-05-06 (×2): qty 1, 180d supply, fill #0

## 2023-04-29 NOTE — Telephone Encounter (Signed)
Spoke with patient regarding Prolia injection patient verbalize understanding that Rx will be sent to pharmacy someone will give her a call from pharmacy to patient prescription cost and have injection mailed to our office. Patient agrees to call if she does not hear from someone by next Tuesday. She will still call after payment is made for injection to schedule Prolia injection appointment at the office.   Patients Prolia is through pharmacy no PA needed co-pay of $25 at time of appointment.

## 2023-04-30 NOTE — Telephone Encounter (Signed)
Spoke with patient who verbally understood Prolia injection ordered someone will call to go over cost from pharmacy. Patient agrees to call after she speaks with pharmacy to schedule appointment also understands OV for injection co-pay will be $25 at time of visit.

## 2023-05-04 DIAGNOSIS — H903 Sensorineural hearing loss, bilateral: Secondary | ICD-10-CM | POA: Diagnosis not present

## 2023-05-06 ENCOUNTER — Other Ambulatory Visit (HOSPITAL_COMMUNITY): Payer: Self-pay

## 2023-05-06 ENCOUNTER — Other Ambulatory Visit: Payer: Self-pay

## 2023-05-06 ENCOUNTER — Telehealth: Payer: Self-pay | Admitting: Family Medicine

## 2023-05-06 NOTE — Telephone Encounter (Signed)
Duplicate message. 

## 2023-05-06 NOTE — Telephone Encounter (Signed)
Spoke with patient scheduled appointment for Prolia injection. Patient spoke with pharmacy and paid fee for injection

## 2023-05-06 NOTE — Telephone Encounter (Signed)
Pt checking on the status of her prolia shot.

## 2023-05-07 ENCOUNTER — Other Ambulatory Visit: Payer: Self-pay

## 2023-05-07 ENCOUNTER — Other Ambulatory Visit (HOSPITAL_COMMUNITY): Payer: Self-pay

## 2023-05-12 ENCOUNTER — Ambulatory Visit (INDEPENDENT_AMBULATORY_CARE_PROVIDER_SITE_OTHER): Payer: PPO

## 2023-05-12 DIAGNOSIS — M810 Age-related osteoporosis without current pathological fracture: Secondary | ICD-10-CM

## 2023-05-12 MED ORDER — DENOSUMAB 60 MG/ML ~~LOC~~ SOSY
60.0000 mg | PREFILLED_SYRINGE | Freq: Once | SUBCUTANEOUS | Status: AC
  Administered 2023-05-12: 60 mg via SUBCUTANEOUS

## 2023-05-12 NOTE — Progress Notes (Signed)
..    Nurse Visit   Date of Encounter: 05/12/2023 ID: Catrina, Chisman 1951/08/09, MRN [id number]  PCP:  Mliss Sax, MD   [facility] Providers Cardiologist:  None      Visit Details   VS:  There were no vitals taken for this visit. , BMI There is no height or weight on file to calculate BMI.  Wt Readings from Last 3 Encounters:  03/22/23 125 lb (56.7 kg)  07/09/22 126 lb 6.4 oz (57.3 kg)  04/07/22 126 lb 12.8 oz (57.5 kg)     Reason for visit: Patient here for Prolia injection Performed today: Prolia injection only  Changes (medications, testing, etc.) : none Length of Visit: 15 minutes    Medications Adjustments/Labs and Tests Ordered: No orders of the defined types were placed in this encounter.  No orders of the defined types were placed in this encounter.    Levi Aland, CMA  05/12/2023 2:08 PM

## 2023-05-18 ENCOUNTER — Ambulatory Visit: Payer: PPO | Admitting: Internal Medicine

## 2023-06-21 ENCOUNTER — Ambulatory Visit: Payer: PPO | Admitting: Internal Medicine

## 2023-06-21 ENCOUNTER — Encounter: Payer: Self-pay | Admitting: Internal Medicine

## 2023-06-21 ENCOUNTER — Other Ambulatory Visit (INDEPENDENT_AMBULATORY_CARE_PROVIDER_SITE_OTHER): Payer: PPO

## 2023-06-21 VITALS — BP 122/62 | HR 75 | Ht 65.0 in | Wt 128.0 lb

## 2023-06-21 DIAGNOSIS — K625 Hemorrhage of anus and rectum: Secondary | ICD-10-CM | POA: Diagnosis not present

## 2023-06-21 DIAGNOSIS — R1310 Dysphagia, unspecified: Secondary | ICD-10-CM

## 2023-06-21 DIAGNOSIS — K529 Noninfective gastroenteritis and colitis, unspecified: Secondary | ICD-10-CM

## 2023-06-21 DIAGNOSIS — K219 Gastro-esophageal reflux disease without esophagitis: Secondary | ICD-10-CM | POA: Diagnosis not present

## 2023-06-21 LAB — CBC WITH DIFFERENTIAL/PLATELET
Basophils Absolute: 0.1 10*3/uL (ref 0.0–0.1)
Basophils Relative: 0.8 % (ref 0.0–3.0)
Eosinophils Absolute: 0 10*3/uL (ref 0.0–0.7)
Eosinophils Relative: 0.2 % (ref 0.0–5.0)
HCT: 37.6 % (ref 36.0–46.0)
Hemoglobin: 12.2 g/dL (ref 12.0–15.0)
Lymphocytes Relative: 17.2 % (ref 12.0–46.0)
Lymphs Abs: 1.7 10*3/uL (ref 0.7–4.0)
MCHC: 32.4 g/dL (ref 30.0–36.0)
MCV: 92.8 fl (ref 78.0–100.0)
Monocytes Absolute: 1.1 10*3/uL — ABNORMAL HIGH (ref 0.1–1.0)
Monocytes Relative: 11.6 % (ref 3.0–12.0)
Neutro Abs: 7 10*3/uL (ref 1.4–7.7)
Neutrophils Relative %: 70.2 % (ref 43.0–77.0)
Platelets: 249 10*3/uL (ref 150.0–400.0)
RBC: 4.05 Mil/uL (ref 3.87–5.11)
RDW: 12.7 % (ref 11.5–15.5)
WBC: 9.9 10*3/uL (ref 4.0–10.5)

## 2023-06-21 NOTE — Progress Notes (Signed)
Chief Complaint: Colitis, dysphagia  HPI : 72 year old female with history of GERD, hiatal hernia, anal fissure presents for follow-up of colitis and to discuss dysphagia  She was recently hospitalized 08/29/2021 to 09/02/2021 for presumed left-sided ischemic colitis.  She had recently traveled to [place] and then subsequently developed bloody diarrhea at that time.  During that hospitalization, she was treated with bowel rest, IV fluids, and antibiotics.  She had significant improvement in her symptoms during the course of her hospitalization.  Interval History: She has had dysphagia three times over the last 3 months. At the same time that she swallowed, she was experiencing something coming up. She has never had this sensation in the past. Endorses dysphagia to solids, not liquids. She did try to swallow water after she felt choked, which then came back up. Eventually the foods would pass on their own. Denies SOB. Endorses some occasional reflux. She is not on anything for reflux. Weight has been stable. Denies abdominal pain. During her last clinic visit it looked like her stools had normalized, but then her stools became abnormal again. Her stools are thin appearing. Other times her stools are mushy and flaky. She has 2-3 BMs per day on average. Denies bloating. She did start trying to take psyllium husks 3 weeks ago, which helped with some abdominal cramping but has not changed the appearance of her stools. She does still see occasional rectal bleeding. Denies any sick contacts or consumption of any abnormal foods. Endorses 2 cups of coffee per day. Denies alcohol use. She has been drinking plenty of water.   Wt Readings from Last 3 Encounters:  06/21/23 128 lb (58.1 kg)  03/22/23 125 lb (56.7 kg)  07/09/22 126 lb 6.4 oz (57.3 kg)    Past Medical History:  Diagnosis Date   Allergy    Anal fissure    Atherosclerosis of aorta (HCC)    seem on CT scan   Cataract    bilateral cataracts  removed   GERD (gastroesophageal reflux disease)    Heart murmur    Hiatal hernia    Internal hemorrhoids    Osteoporosis      Past Surgical History:  Procedure Laterality Date   AUGMENTATION MAMMAPLASTY Bilateral    BREAST ENHANCEMENT SURGERY     CESAREAN SECTION     x 2   COLONOSCOPY     WISDOM TOOTH EXTRACTION     Family History  Problem Relation Age of Onset   Multiple sclerosis Mother    Diabetes Father    Hypertension Father    Colon cancer Neg Hx    Esophageal cancer Neg Hx    Pancreatic cancer Neg Hx    Stomach cancer Neg Hx    Liver disease Neg Hx    Rectal cancer Neg Hx    Social History   Tobacco Use   Smoking status: Never    Passive exposure: Never   Smokeless tobacco: Never  Vaping Use   Vaping status: Never Used  Substance Use Topics   Alcohol use: Yes    Comment: socially   Drug use: No   Current Outpatient Medications  Medication Sig Dispense Refill   Azelaic Acid 15 % gel SMARTSIG:1 sparingly Topical Daily     Loratadine 10 MG CAPS      Multiple Vitamins-Minerals (MULTIVITAMIN WITH MINERALS) tablet Take 1 tablet by mouth daily.     Polyethyl Glyc-Propyl Glyc PF (SYSTANE ULTRA PF) 0.4-0.3 % SOLN      No current facility-administered  medications for this visit.   Allergies  Allergen Reactions   Atorvastatin Other (See Comments)    Myalgias.     Physical Exam: BP 122/62   Pulse 75   Ht 5\' 5"  (1.651 m)   Wt 128 lb (58.1 kg)   BMI 21.30 kg/m  Constitutional: Pleasant,well-developed, female in no acute distress. HEENT: Normocephalic and atraumatic. Conjunctivae are normal. No scleral icterus. Cardiovascular: Normal rate, regular rhythm.  Pulmonary/chest: Effort normal and breath sounds normal. No wheezing, rales or rhonchi. Abdominal: Soft, nondistended, nontender. Bowel sounds active throughout. There are no masses palpable. No hepatomegaly. Extremities: No edema Neurological: Alert and oriented to person place and time. Skin: Skin  is warm and dry. No rashes noted. Psychiatric: Normal mood and affect. Behavior is normal.  Labs 09/2021: CBC and CMP were unremarkable  CT A/P w/contrast 08/29/21: IMPRESSION: 1. Left-sided colitis, with appearance and distribution which favor ischemia. Infectious and inflammatory etiologies felt less likely. 2. Small volume right pelvic and cul-de-sac fluid, likely secondary. 3.  Tiny hiatal hernia. 4.  Aortic Atherosclerosis (ICD10-I70.0). 5. 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  Colonoscopy 12/16/21:  Path: Surgical [P], left sided colon biopsy FOCAL ACUTE COLITIS/CRYPTITIS WITH MINIMAL ARCHITECTURAL CHANGES Microscopic Comment Sections show multiple fragments of colonic mucosa exhibiting focal minimal architectural distortion in the form of focal crypt budding. The lamina propria exhibits the normal complement of mononuclear cells with a very focal neutrophilic infiltrate centered around a single crypt. There is no increase in intraepithelial lymphocytes and the collagen table is of normal thickness. No granulomas or parasites are seen. No foreshortening or basal plasmacytosis is evident. There is no evidence of dysplasia or carcinoma. The architectural and inflammatory changes are very focal and nonspecific. The histologic changes of focal active colitis have been most commonly associated with a resolving acute self-limited colitis; however, certain drugs particularly NSAIDs may elicit similar changes. The focal architectural changes may be associated with diverticular disease or possibly a quiescent/minimally active chronic colitis (not favored). Clinical, endoscopic and microbiologic correlation is recommended.  ASSESSMENT AND PLAN: Dysphagia GERD Change in bowel habits Rectal bleeding History of colitis Hemorrhoids Over the last 3 months, patient has noticed worsened issues with dysphagia to solid foods. She has never had dysphagia like this  in the past. Does have occasional acid reflux. Will plan for EGD for further evaluation. Although he stools had temporarily normalized, patient feels that she is now experiencing more frequent stools again that are different in shape and consistency. Fiber has helped some some abdominal discomfort but she is also still noticing some rectal bleeding. Thus will plan to add on a flex sig to her EGD procedure to ensure that her previously visualized colitis has fully resolved. I asked the patient to try to take some Miralax to see if this helps induce more substantial BMs - Cont daily fiber supplement - Start daily dose of Miralax - Check CBC, CMP, CRP - EGD/flex sig [facility]  Eulah Pont, MD   I spent 41 minutes of time, including in depth chart review, independent review of results as outlined above, communicating results with the patient directly, face-to-face time with the patient, coordinating care, ordering studies and medications as appropriate, and documentation.

## 2023-06-21 NOTE — Patient Instructions (Signed)
Your provider has requested that you go to the basement level for lab work before leaving today. Press "B" on the elevator. The lab is located at the first door on the left as you exit the elevator.   You have been scheduled for a Endoscopy flexible sigmoidoscopy. Please follow the written instructions given to you at your visit today.  If you use inhalers (even only as needed), please bring them with you on the day of your procedure.  DO NOT TAKE 7 DAYS PRIOR TO TEST- Trulicity (dulaglutide) Ozempic, Wegovy (semaglutide) Mounjaro (tirzepatide) Bydureon Bcise (exanatide extended release)  DO NOT TAKE 1 DAY PRIOR TO YOUR TEST Rybelsus (semaglutide) Adlyxin (lixisenatide) Victoza (liraglutide) Byetta (exanatide) ___________________________________________________________________________   Start taking Miralax daily  If your blood pressure at your visit was 140/90 or greater, please contact your primary care physician to follow up on this.  _______________________________________________________  If you are age 73 or older, your body mass index should be between 23-30. Your Body mass index is 21.3 kg/m. If this is out of the aforementioned range listed, please consider follow up with your Primary Care Provider.  If you are age 16 or younger, your body mass index should be between 19-25. Your Body mass index is 21.3 kg/m. If this is out of the aformentioned range listed, please consider follow up with your Primary Care Provider.   ________________________________________________________  The [place] [facility] providers would like to encourage you to use [facility] to communicate with providers for non-urgent requests or questions.  Due to long hold times on the telephone, sending your provider a message by [facility] may be a faster and more efficient way to get a response.  Please allow 48 business hours for a response.  Please remember that this is for non-urgent requests.   _______________________________________________________   Due to recent changes in healthcare laws, you may see the results of your imaging and laboratory studies on MyChart before your provider has had a chance to review them.  We understand that in some cases there may be results that are confusing or concerning to you. Not all laboratory results come back in the same time frame and the provider may be waiting for multiple results in order to interpret others.  Please give [place] 48 hours in order for your provider to thoroughly review all the results before contacting the office for clarification of your results.    Thank you for entrusting me with your care and for choosing [facility], Dr. Eulah Pont

## 2023-06-22 LAB — COMPREHENSIVE METABOLIC PANEL
ALT: 14 U/L (ref 0–35)
AST: 19 U/L (ref 0–37)
Albumin: 4.4 g/dL (ref 3.5–5.2)
Alkaline Phosphatase: 64 U/L (ref 39–117)
BUN: 16 mg/dL (ref 6–23)
CO2: 26 mEq/L (ref 19–32)
Calcium: 9.6 mg/dL (ref 8.4–10.5)
Chloride: 103 mEq/L (ref 96–112)
Creatinine, Ser: 0.71 mg/dL (ref 0.40–1.20)
GFR: 84.11 mL/min (ref >60.00)
Glucose, Bld: 97 mg/dL (ref 70–99)
Potassium: 4.1 mEq/L (ref 3.5–5.1)
Sodium: 137 mEq/L (ref 135–145)
Total Bilirubin: 0.4 mg/dL (ref 0.2–1.2)
Total Protein: 6.9 g/dL (ref 6.0–8.3)

## 2023-06-22 LAB — HIGH SENSITIVITY CRP: CRP, High Sensitivity: 2.14 mg/L (ref 0.000–5.000)

## 2023-07-02 DIAGNOSIS — H524 Presbyopia: Secondary | ICD-10-CM | POA: Diagnosis not present

## 2023-07-02 DIAGNOSIS — Z961 Presence of intraocular lens: Secondary | ICD-10-CM | POA: Diagnosis not present

## 2023-08-05 DIAGNOSIS — D485 Neoplasm of uncertain behavior of skin: Secondary | ICD-10-CM | POA: Diagnosis not present

## 2023-08-05 DIAGNOSIS — Z85828 Personal history of other malignant neoplasm of skin: Secondary | ICD-10-CM | POA: Diagnosis not present

## 2023-08-05 DIAGNOSIS — D2371 Other benign neoplasm of skin of right lower limb, including hip: Secondary | ICD-10-CM | POA: Diagnosis not present

## 2023-08-05 DIAGNOSIS — L820 Inflamed seborrheic keratosis: Secondary | ICD-10-CM | POA: Diagnosis not present

## 2023-08-05 DIAGNOSIS — L821 Other seborrheic keratosis: Secondary | ICD-10-CM | POA: Diagnosis not present

## 2023-08-05 DIAGNOSIS — D225 Melanocytic nevi of trunk: Secondary | ICD-10-CM | POA: Diagnosis not present

## 2023-08-05 DIAGNOSIS — C4401 Basal cell carcinoma of skin of lip: Secondary | ICD-10-CM | POA: Diagnosis not present

## 2023-08-07 ENCOUNTER — Encounter: Payer: Self-pay | Admitting: Certified Registered Nurse Anesthetist

## 2023-08-12 ENCOUNTER — Encounter: Payer: Self-pay | Admitting: Internal Medicine

## 2023-08-12 ENCOUNTER — Other Ambulatory Visit: Payer: PPO | Admitting: Internal Medicine

## 2023-08-12 ENCOUNTER — Ambulatory Visit (AMBULATORY_SURGERY_CENTER): Payer: PPO | Admitting: Internal Medicine

## 2023-08-12 VITALS — BP 122/52 | HR 65 | Temp 98.6°F | Resp 14 | Ht 65.0 in | Wt 128.0 lb

## 2023-08-12 DIAGNOSIS — K625 Hemorrhage of anus and rectum: Secondary | ICD-10-CM

## 2023-08-12 DIAGNOSIS — K649 Unspecified hemorrhoids: Secondary | ICD-10-CM | POA: Diagnosis not present

## 2023-08-12 DIAGNOSIS — K222 Esophageal obstruction: Secondary | ICD-10-CM

## 2023-08-12 DIAGNOSIS — R1310 Dysphagia, unspecified: Secondary | ICD-10-CM

## 2023-08-12 DIAGNOSIS — K2289 Other specified disease of esophagus: Secondary | ICD-10-CM | POA: Diagnosis not present

## 2023-08-12 DIAGNOSIS — K921 Melena: Secondary | ICD-10-CM

## 2023-08-12 MED ORDER — OMEPRAZOLE 40 MG PO CPDR: 40.0000 mg | DELAYED_RELEASE_CAPSULE | Freq: Every day | ORAL | 3 refills | Status: DC

## 2023-08-12 MED ORDER — HYDROCORTISONE (PERIANAL) 2.5 % EX CREA: 1.0000 | TOPICAL_CREAM | Freq: Two times a day (BID) | CUTANEOUS | 1 refills | Status: DC

## 2023-08-12 MED ORDER — FLEET ENEMA RE ENEM
1.0000 | ENEMA | Freq: Once | RECTAL | Status: AC
  Administered 2023-08-12: 1 via RECTAL

## 2023-08-12 MED ORDER — SODIUM CHLORIDE 0.9 % IV SOLN: 500.0000 mL | INTRAVENOUS | Status: DC

## 2023-08-12 NOTE — Progress Notes (Signed)
0809 Robinul 0.1 mg IV given due large amount of secretions upon assessment.  MD made aware, vss

## 2023-08-12 NOTE — Progress Notes (Signed)
GASTROENTEROLOGY PROCEDURE H&P NOTE   Primary Care Physician: Mliss Sax, MD    Reason for Procedure:   Dysphagia, rectal bleeding, history of coliti  Plan:    EGD/flexible sigmoidoscopy  Patient is appropriate for endoscopic procedure(s) in the ambulatory ([facility]) setting.  The nature of the procedure, as well as the risks, benefits, and alternatives were carefully and thoroughly reviewed with the patient. Ample time for discussion and questions allowed. The patient understood, was satisfied, and agreed to proceed.     HPI: Virginia Fox is a 72 y.o. female who presents for EGD/flexible sigmoidoscopy for evaluation of dysphagia and rectal bleeding, history of colitis.  Patient was most recently seen in the Gastroenterology Clinic on 06/21/23.  No interval change in medical history since that appointment. Please refer to that note for full details regarding GI history and clinical presentation.   Past Medical History:  Diagnosis Date   Allergy    Anal fissure    Atherosclerosis of aorta (HCC)    seem on CT scan   Cataract    bilateral cataracts removed   GERD (gastroesophageal reflux disease)    Heart murmur    Hiatal hernia    Internal hemorrhoids    Osteoporosis     Past Surgical History:  Procedure Laterality Date   AUGMENTATION MAMMAPLASTY Bilateral    BREAST ENHANCEMENT SURGERY     CESAREAN SECTION     x 2   COLONOSCOPY     WISDOM TOOTH EXTRACTION      Prior to Admission medications   Medication Sig Start Date End Date Taking? Authorizing Provider  Azelaic Acid 15 % gel SMARTSIG:1 sparingly Topical Daily 03/11/22  Yes [provider]  Loratadine 10 MG CAPS  12/07/16  Yes [provider]  Multiple Vitamins-Minerals (MULTIVITAMIN WITH MINERALS) tablet Take 1 tablet by mouth daily.   Yes [provider]  Polyethyl Glyc-Propyl Glyc PF (SYSTANE ULTRA PF) 0.4-0.3 % SOLN    Yes [provider]  denosumab (PROLIA) 60  MG/ML SOSY injection Inject 60 mg into the skin every 6 (six) months. 07/11/18   [provider]    Current Outpatient Medications  Medication Sig Dispense Refill   Azelaic Acid 15 % gel SMARTSIG:1 sparingly Topical Daily     Loratadine 10 MG CAPS      Multiple Vitamins-Minerals (MULTIVITAMIN WITH MINERALS) tablet Take 1 tablet by mouth daily.     Polyethyl Glyc-Propyl Glyc PF (SYSTANE ULTRA PF) 0.4-0.3 % SOLN      denosumab (PROLIA) 60 MG/ML SOSY injection Inject 60 mg into the skin every 6 (six) months.     Current Facility-Administered Medications  Medication Dose Route Frequency Provider Last Rate Last Admin   0.9 %  sodium chloride infusion  500 mL Intravenous Continuous Imogene Burn, MD        Allergies as of 08/12/2023 - Review Complete 08/12/2023  Allergen Reaction Noted   Atorvastatin Other (See Comments) 04/07/2022    Family History  Problem Relation Age of Onset   Multiple sclerosis Mother    Diabetes Father    Hypertension Father    Colon cancer Neg Hx    Esophageal cancer Neg Hx    Pancreatic cancer Neg Hx    Stomach cancer Neg Hx    Liver disease Neg Hx    Rectal cancer Neg Hx     Social History   Socioeconomic History   Marital status: Widowed    Spouse name: Not on file  Number of children: Not on file   Years of education: Not on file   Highest education level: Not on file  Occupational History   Occupation: Retired  Tobacco Use   Smoking status: Never    Passive exposure: Never   Smokeless tobacco: Never  Vaping Use   Vaping status: Never Used  Substance and Sexual Activity   Alcohol use: Not Currently    Comment: socially   Drug use: No   Sexual activity: Not Currently    Birth control/protection: Post-menopausal  Other Topics Concern   Not on file  Social History Narrative   Not on file   Social Determinants of Health   Financial Resource Strain: Low Risk  (03/22/2023)   Overall Financial Resource Strain (CARDIA)     Difficulty of Paying Living Expenses: Not hard at all  Food Insecurity: No Food Insecurity (03/22/2023)   Hunger Vital Sign    Worried About Running Out of Food in the Last Year: Never true    Ran Out of Food in the Last Year: Never true  Transportation Needs: No Transportation Needs (03/22/2023)   PRAPARE - Administrator, Civil Service (Medical): No    Lack of Transportation (Non-Medical): No  Physical Activity: Sufficiently Active (03/22/2023)   Exercise Vital Sign    Days of Exercise per Week: 5 days    Minutes of Exercise per Session: 40 min  Stress: No Stress Concern Present (03/22/2023)   [employer] of Occupational Health - Occupational Stress Questionnaire    Feeling of Stress : Not at all  Social Connections: Unknown (03/22/2023)   Social Connection and Isolation Panel [NHANES]    Frequency of Communication with Friends and Family: More than three times a week    Frequency of Social Gatherings with Friends and Family: More than three times a week    Attends Religious Services: Not on file    Active Member of Clubs or Organizations: Yes    Attends Banker Meetings: 1 to 4 times per year    Marital Status: Widowed  Recent Concern: Social Connections - Moderately Isolated (03/22/2023)   Social Connection and Isolation Panel [NHANES]    Frequency of Communication with Friends and Family: More than three times a week    Frequency of Social Gatherings with Friends and Family: More than three times a week    Attends Religious Services: Never    Database administrator or Organizations: Yes    Attends Banker Meetings: 1 to 4 times per year    Marital Status: Widowed  Intimate Partner Violence: Not At Risk (04/22/2022)   Humiliation, Afraid, Rape, and Kick questionnaire    Fear of Current or Ex-Partner: No    Emotionally Abused: No    Physically Abused: No    Sexually Abused: No    Physical Exam: Vital signs in last 24 hours: BP (!)  142/63   Pulse 69   Temp 98.6 F (37 C)   Resp 14   Ht 5\' 5"  (1.651 m)   Wt 128 lb (58.1 kg)   SpO2 100%   BMI 21.30 kg/m  GEN: NAD EYE: Sclerae anicteric ENT: MMM CV: Non-tachycardic Pulm: No increased WOB GI: Soft NEURO:  Alert & Oriented   Eulah Pont, MD [facility]   08/12/2023 8:14 AM

## 2023-08-12 NOTE — Op Note (Addendum)
[facility] Patient Name: Virginia Fox Procedure Date: 08/12/2023 8:13 AM MRN: [id number] Endoscopist: Particia Lather , , [id number] Age: 72 Referring MD:  Date of Birth: [birth year] Gender: Female Account #: [id number] Procedure:                Upper GI endoscopy Indications:              Dysphagia Medicines:                Monitored Anesthesia Care Procedure:                Pre-Anesthesia Assessment:                           - Prior to the procedure, a History and Physical                            was performed, and patient medications and                            allergies were reviewed. The patient's tolerance of                            previous anesthesia was also reviewed. The risks                            and benefits of the procedure and the sedation                            options and risks were discussed with the patient.                            All questions were answered, and informed consent                            was obtained. Prior Anticoagulants: The patient has                            taken no anticoagulant or antiplatelet agents. ASA                            Grade Assessment: II - A patient with mild systemic                            disease. After reviewing the risks and benefits,                            the patient was deemed in satisfactory condition to                            undergo the procedure.                           After obtaining informed consent, the endoscope was  passed under direct vision. Throughout the                            procedure, the patient's blood pressure, pulse, and                            oxygen saturations were monitored continuously. The                            Olympus Scope [id number] was introduced through the                            mouth, and advanced to the second part of duodenum.                            The upper GI endoscopy was  accomplished without                            difficulty. The patient tolerated the procedure                            well. Scope In: Scope Out: Findings:                 One benign-appearing, intrinsic moderate                            (circumferential scarring or stenosis; an endoscope                            may pass) stenosis was found at the                            gastroesophageal junction. This stenosis measured                            less than one cm (in length). The stenosis was                            traversed. A TTS dilator was passed through the                            scope. Dilation with an 18-19-20 mm x 8 cm CRE                            balloon dilator was performed to 20 mm. The                            dilation site was examined and showed mild mucosal                            disruption. Biopsies were taken with a cold forceps  for histology.                           Biopsies were taken with a cold forceps in the                            proximal esophagus and in the distal esophagus for                            histology.                           The entire examined stomach was normal.                           The examined duodenum was normal. Complications:            No immediate complications. Estimated Blood Loss:     Estimated blood loss was minimal. Impression:               - Benign-appearing esophageal stenosis. Dilated.                            Biopsied.                           - Normal stomach.                           - Normal examined duodenum.                           - Biopsies were taken with a cold forceps for                            histology in the proximal esophagus and in the                            distal esophagus. Recommendation:           - Await pathology results.                           - Use Prilosec (omeprazole) 40 mg PO daily daily.                           -  Return to GI clinic in 2-3 months.                           - Perform a colonoscopy today. Dr Particia Lather "[place]" Leonides Schanz,  08/12/2023 8:56:37 AM

## 2023-08-12 NOTE — Progress Notes (Signed)
Called to room to assist during endoscopic procedure.  Patient ID and intended procedure confirmed with present staff. Received instructions for my participation in the procedure from the performing physician.  

## 2023-08-12 NOTE — Patient Instructions (Addendum)
-await pathology results -Continue present medications Start Omeprazole 40 mg daily. Start Anusol HC cream twice a day for 7 days  Prescription called in to [facility] -Return to GI clinic on 11/18/23 @ 3:00 pm  -Handout on Hemorrhoid banding provided   YOU HAD AN ENDOSCOPIC PROCEDURE TODAY AT THE [facility]:   Refer to the procedure report that was given to you for any specific questions about what was found during the examination.  If the procedure report does not answer your questions, please call your gastroenterologist to clarify.  If you requested that your care partner not be given the details of your procedure findings, then the procedure report has been included in a sealed envelope for you to review at your convenience later.  YOU SHOULD EXPECT: Some feelings of bloating in the abdomen. Passage of more gas than usual.  Walking can help get rid of the air that was put into your GI tract during the procedure and reduce the bloating. If you had a lower endoscopy (such as a colonoscopy or flexible sigmoidoscopy) you may notice spotting of blood in your stool or on the toilet paper. If you underwent a bowel prep for your procedure, you may not have a normal bowel movement for a few days.  Please Note:  You might notice some irritation and congestion in your nose or some drainage.  This is from the oxygen used during your procedure.  There is no need for concern and it should clear up in a day or so.  SYMPTOMS TO REPORT IMMEDIATELY:  Following lower endoscopy (colonoscopy or flexible sigmoidoscopy):  Excessive amounts of blood in the stool  Significant tenderness or worsening of abdominal pains  Swelling of the abdomen that is new, acute  Fever of 100F or higher  Following upper endoscopy (EGD)  Vomiting of blood or coffee ground material  New chest pain or pain under the shoulder blades  Painful or persistently difficult swallowing  New shortness of breath  Fever of 100F or  higher  Black, tarry-looking stools  For urgent or emergent issues, a gastroenterologist can be reached at any hour by calling [phone]. Do not use MyChart messaging for urgent concerns.    DIET:  We do recommend a small meal at first, but then you may proceed to your regular diet.  Drink plenty of fluids but you should avoid alcoholic beverages for 24 hours.  ACTIVITY:  You should plan to take it easy for the rest of today and you should NOT DRIVE or use heavy machinery until tomorrow (because of the sedation medicines used during the test).    FOLLOW UP: Our staff will call the number listed on your records the next business day following your procedure.  We will call around 7:15- 8:00 am to check on you and address any questions or concerns that you may have regarding the information given to you following your procedure. If we do not reach you, we will leave a message.     If any biopsies were taken you will be contacted by phone or by letter within the next 1-3 weeks.  Please call us at [phone] if you have not heard about the biopsies in 3 weeks.    SIGNATURES/CONFIDENTIALITY: You and/or your care partner have signed paperwork which will be entered into your electronic medical record.  These signatures attest to the fact that that the information above on your After Visit Summary has been reviewed and is understood.  Full responsibility  of the confidentiality of this discharge information lies with you and/or your care-partner.

## 2023-08-12 NOTE — Op Note (Signed)
[facility] Patient Name: Virginia Fox Procedure Date: 08/12/2023 8:13 AM MRN: [id number] Endoscopist: Particia Lather , , [phone] Age: 72 Referring MD:  Date of Birth: [birth year] Gender: Female Account #: [id number] Procedure:                Flexible Sigmoidoscopy Indications:              Hematochezia, Change in stool caliber Medicines:                Monitored Anesthesia Care Procedure:                Pre-Anesthesia Assessment:                           - Prior to the procedure, a History and Physical                            was performed, and patient medications and                            allergies were reviewed. The patient's tolerance of                            previous anesthesia was also reviewed. The risks                            and benefits of the procedure and the sedation                            options and risks were discussed with the patient.                            All questions were answered, and informed consent                            was obtained. Prior Anticoagulants: The patient has                            taken no anticoagulant or antiplatelet agents. ASA                            Grade Assessment: II - A patient with mild systemic                            disease. After reviewing the risks and benefits,                            the patient was deemed in satisfactory condition to                            undergo the procedure.                           After obtaining informed consent, the scope was  passed under direct vision. The Olympus Scope                            [id number] was introduced through the anus and                            advanced to the the left transverse colon. The                            flexible sigmoidoscopy was accomplished without                            difficulty. The patient tolerated the procedure                            well. Scope In:  8:34:50 AM Scope Out: 8:42:56 AM Total Procedure Duration: 0 hours 8 minutes 6 seconds  Findings:                 Irritated internal hemorrhoids were found during                            retroflexion.                           The exam was otherwise without abnormality.                           Biopsies for histology were taken with a cold                            forceps from the left transverse colon, descending                            colon, sigmoid colon and rectum for evaluation of                            microscopic colitis. Complications:            No immediate complications. Estimated Blood Loss:     Estimated blood loss was minimal. Impression:               - Irritated internal hemorrhoids.                           - The examination was otherwise normal.                           - Biopsies were taken with a cold forceps from the                            left transverse colon, descending colon, sigmoid                            colon and rectum for evaluation of microscopic  colitis. Recommendation:           - Discharge patient to home (with escort).                           - Await pathology results.                           - Anusol HC cream BID for 7 days. If not effective,                            can consider hemorrhoidal banding in the future.                           - The findings and recommendations were discussed                            with the patient. Dr Particia Lather "[place]" Virginia Fox,  08/12/2023 9:00:19 AM

## 2023-08-12 NOTE — Progress Notes (Signed)
Report given to PACU, vss 

## 2023-08-13 ENCOUNTER — Telehealth: Payer: Self-pay | Admitting: *Deleted

## 2023-08-13 NOTE — Telephone Encounter (Signed)
  Follow up Call-     08/12/2023    7:29 AM 12/16/2021   10:32 AM  Call back number  Post procedure Call Back phone  # [phone] #[phone] cell  Permission to leave phone message Yes Yes     Patient questions:  Do you have a fever, pain , or abdominal swelling? No. Pain Score  0 *  Have you tolerated food without any problems? Yes.    Have you been able to return to your normal activities? Yes.    Do you have any questions about your discharge instructions: Diet   No. Medications  No. Follow up visit  No.  Do you have questions or concerns about your Care? No.  Actions: * If pain score is 4 or above: No action needed, pain <4.

## 2023-08-16 ENCOUNTER — Encounter: Payer: Self-pay | Admitting: Internal Medicine

## 2023-08-25 IMAGING — MG DIGITAL SCREENING BREAST BILAT IMPLANT W/ TOMO W/ CAD
8 of 12 series · 8 of 28 positions shown · non-contrast
Comparison: Previous exam(s).

CLINICAL DATA: Screening.

EXAM:
DIGITAL SCREENING BILATERAL MAMMOGRAM WITH IMPLANTS, CAD AND
TOMOSYNTHESIS
TECHNIQUE: Bilateral screening digital craniocaudal and mediolateral oblique
mammograms were obtained. Bilateral screening digital breast
tomosynthesis was performed. The images were evaluated with
computer-aided detection. Standard and/or implant displaced views
were performed.

[L MLO]
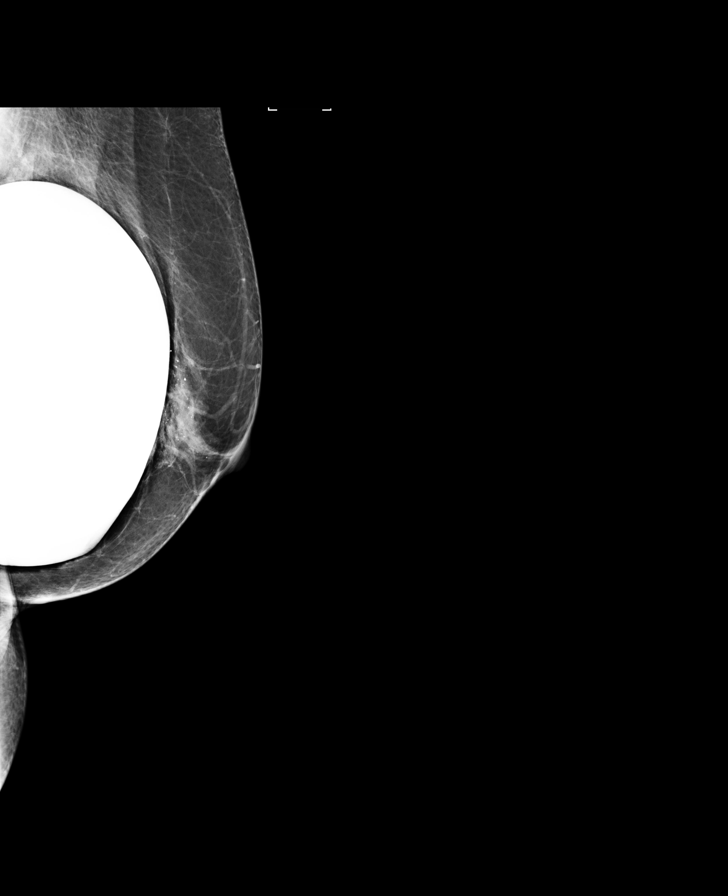

[R CC]
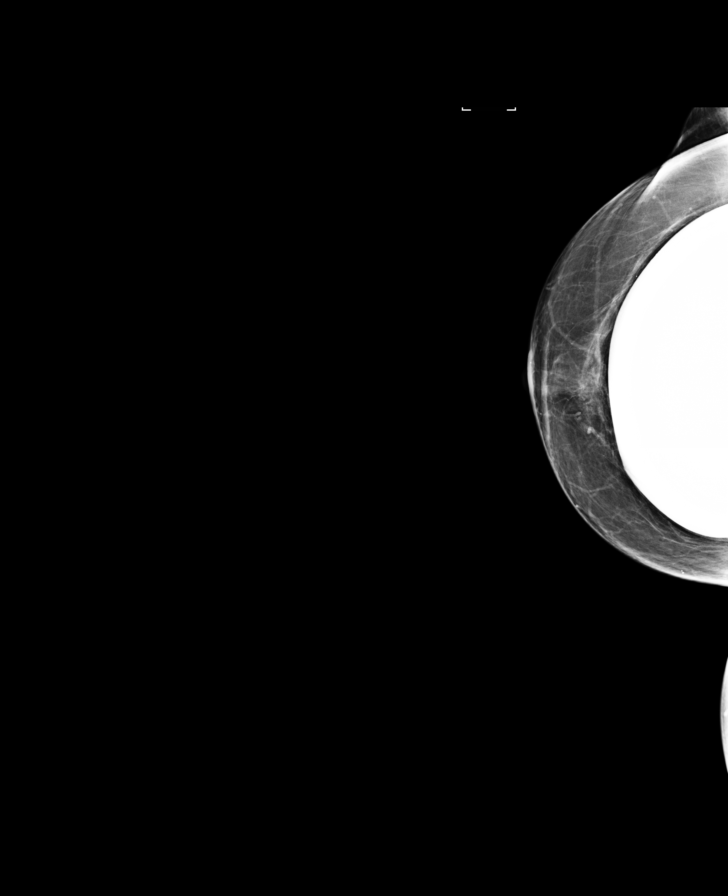

[L CC]
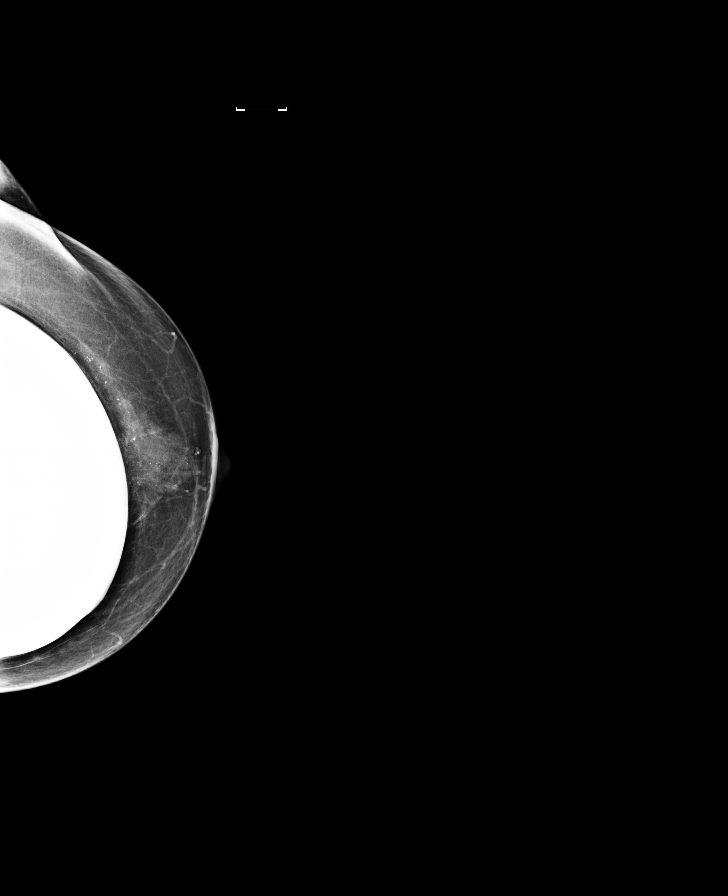

[R MLO]
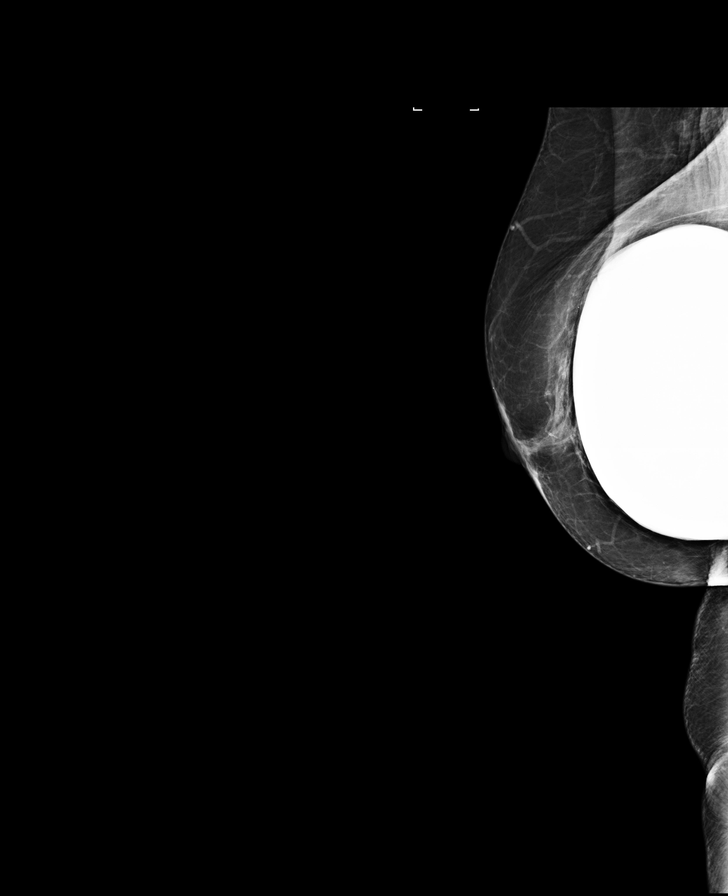

[R MLO synth-2D]
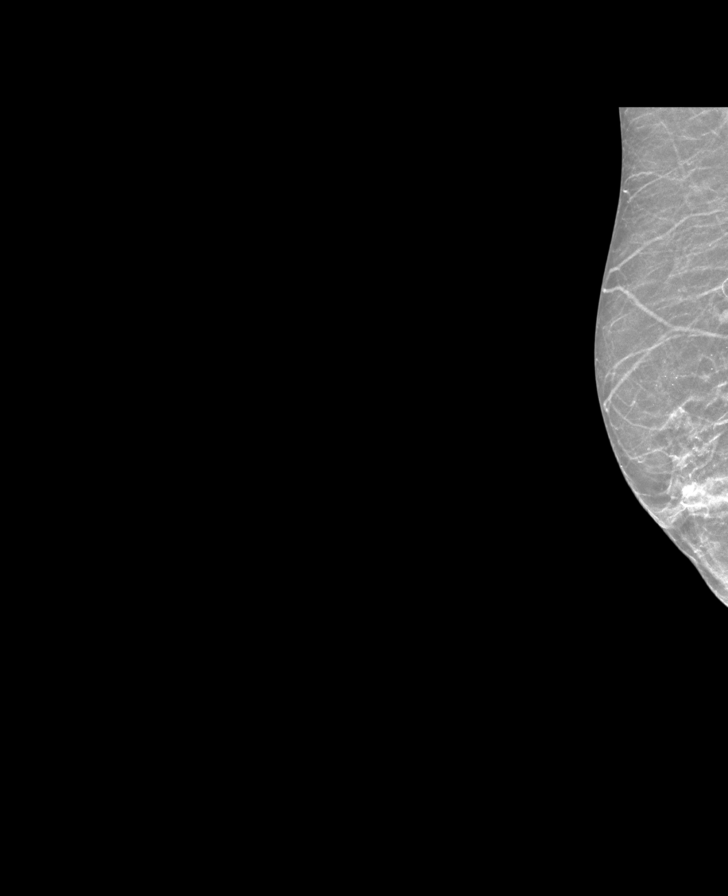

[L CC synth-2D]
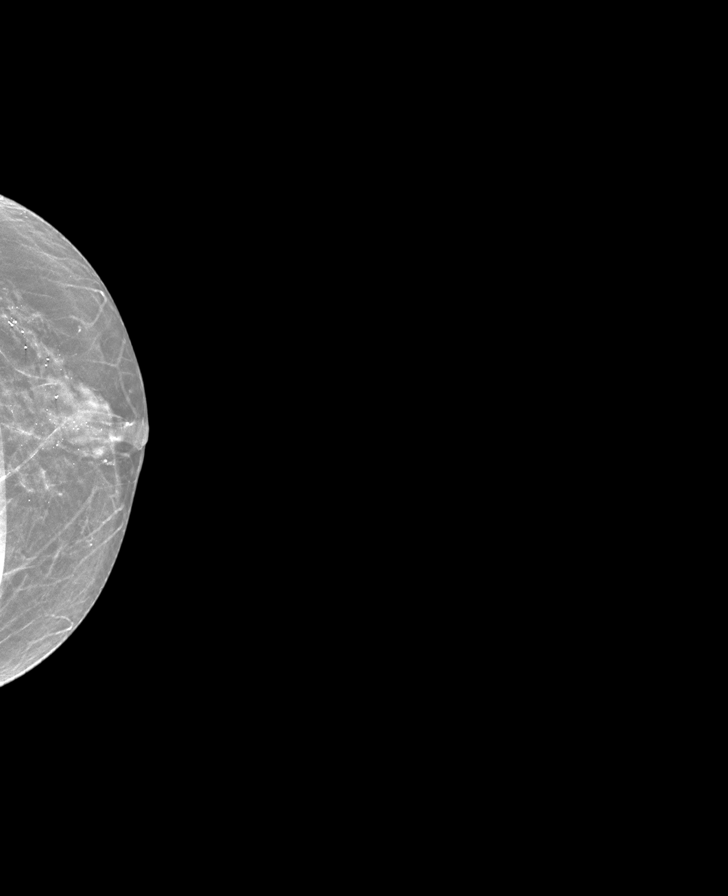

[L MLO synth-2D]
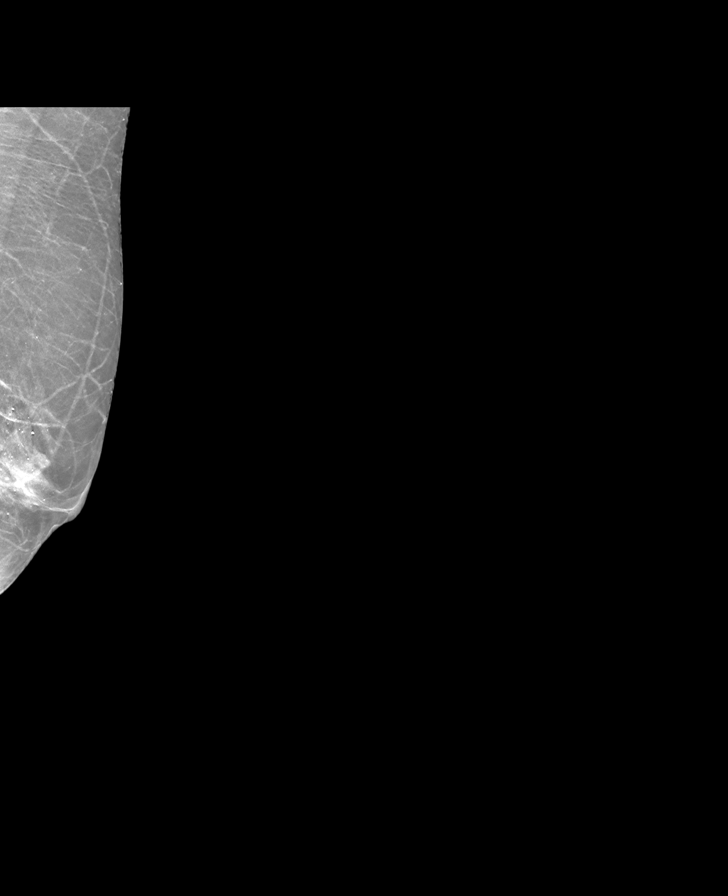

[R CC synth-2D]
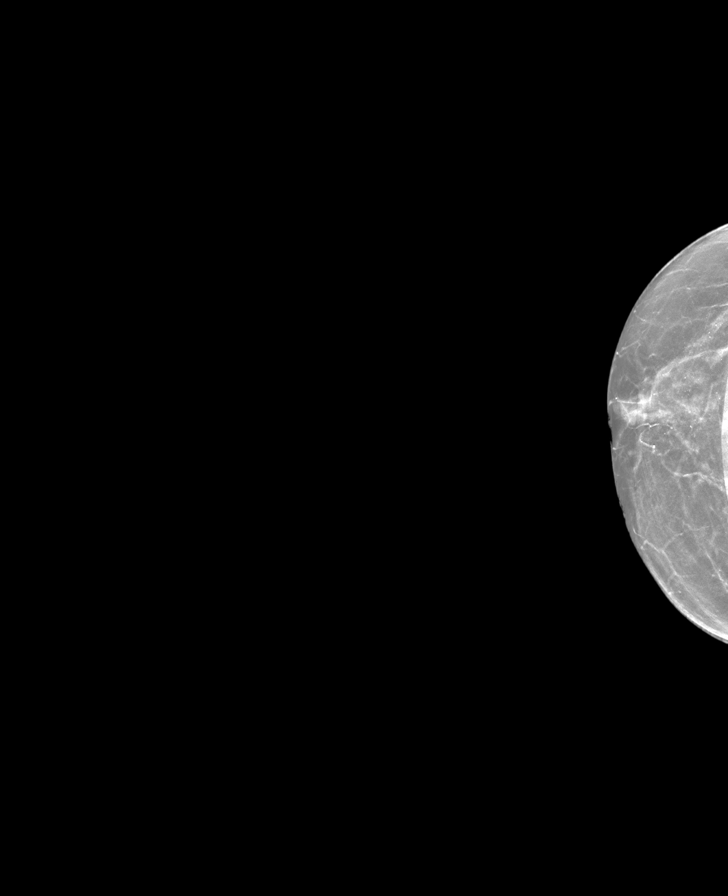

[8 of 28 positions shown; findings below may reference images not displayed]

ACR Breast Density Category b: There are scattered areas of
fibroglandular density.
FINDINGS: The patient has retropectoral implants. There are no findings
suspicious for malignancy.
IMPRESSION: No mammographic evidence of malignancy. A result letter of this
screening mammogram will be mailed directly to the patient.

RECOMMENDATION:
Screening mammogram in one year. (Code:SE-S-JMG)

BI-RADS CATEGORY  1:  Negative.

## 2023-08-28 ENCOUNTER — Encounter (HOSPITAL_COMMUNITY): Payer: Self-pay

## 2023-10-19 ENCOUNTER — Other Ambulatory Visit: Payer: Self-pay | Admitting: Family Medicine

## 2023-10-19 ENCOUNTER — Other Ambulatory Visit: Payer: Self-pay

## 2023-10-19 DIAGNOSIS — Z1231 Encounter for screening mammogram for malignant neoplasm of breast: Secondary | ICD-10-CM

## 2023-10-25 DIAGNOSIS — C4401 Basal cell carcinoma of skin of lip: Secondary | ICD-10-CM | POA: Diagnosis not present

## 2023-10-26 ENCOUNTER — Emergency Department (HOSPITAL_BASED_OUTPATIENT_CLINIC_OR_DEPARTMENT_OTHER): Payer: PPO

## 2023-10-26 ENCOUNTER — Telehealth: Payer: Self-pay | Admitting: Family Medicine

## 2023-10-26 ENCOUNTER — Encounter (HOSPITAL_BASED_OUTPATIENT_CLINIC_OR_DEPARTMENT_OTHER): Payer: Self-pay | Admitting: Urology

## 2023-10-26 ENCOUNTER — Other Ambulatory Visit: Payer: Self-pay

## 2023-10-26 ENCOUNTER — Emergency Department (HOSPITAL_BASED_OUTPATIENT_CLINIC_OR_DEPARTMENT_OTHER)
Admission: EM | Admit: 2023-10-26 | Discharge: 2023-10-26 | Disposition: A | Discharge status: Home / Self Care | Payer: PPO | Attending: Emergency Medicine | Admitting: Emergency Medicine

## 2023-10-26 DIAGNOSIS — M810 Age-related osteoporosis without current pathological fracture: Secondary | ICD-10-CM

## 2023-10-26 DIAGNOSIS — R002 Palpitations: Secondary | ICD-10-CM

## 2023-10-26 LAB — TSH: TSH: 2.592 u[IU]/mL (ref 0.350–4.500)

## 2023-10-26 LAB — T4, FREE: Free T4: 0.96 ng/dL (ref 0.61–1.12)

## 2023-10-26 LAB — BASIC METABOLIC PANEL
Anion gap: 6 (ref 5–15)
BUN: 17 mg/dL (ref 8–23)
CO2: 26 mmol/L (ref 22–32)
Calcium: 9 mg/dL (ref 8.9–10.3)
Chloride: 102 mmol/L (ref 98–111)
Creatinine, Ser: 0.67 mg/dL (ref 0.44–1.00)
GFR, Estimated: >60 mL/min (ref >60)
Glucose, Bld: 169 mg/dL — ABNORMAL HIGH (ref 70–99)
Potassium: 3.7 mmol/L (ref 3.5–5.1)
Sodium: 134 mmol/L — ABNORMAL LOW (ref 135–145)

## 2023-10-26 LAB — CBC
HCT: 36.8 % (ref 36.0–46.0)
Hemoglobin: 11.8 g/dL — ABNORMAL LOW (ref 12.0–15.0)
MCH: 30.3 pg (ref 26.0–34.0)
MCHC: 32.1 g/dL (ref 30.0–36.0)
MCV: 94.4 fL (ref 80.0–100.0)
Platelets: 241 10*3/uL (ref 150–400)
RBC: 3.9 MIL/uL (ref 3.87–5.11)
RDW: 12.5 % (ref 11.5–15.5)
WBC: 9.1 10*3/uL (ref 4.0–10.5)
nRBC: 0 % (ref 0.0–0.2)

## 2023-10-26 LAB — TROPONIN I (HIGH SENSITIVITY): Troponin I (High Sensitivity): 3 ng/L (ref <18)

## 2023-10-26 LAB — MAGNESIUM: Magnesium: 2.2 mg/dL (ref 1.7–2.4)

## 2023-10-26 NOTE — ED Triage Notes (Signed)
Pt states feel like her heart is beating fast and feels lightheaded that started at 1200 but feels like it has resolved at this time  States this is the second time this week  Denies pain   No cardiac history

## 2023-10-26 NOTE — Telephone Encounter (Signed)
Spoke to nurse triage in regards to patient having a fast heart beat with headache. PT was unable to be seen in office due to unavailable open slots and needed to be seen within 4 hour time frame. PT was advised to go to Urgent Care or ED for evaluation; triage nurse agreed. A follow up call will be made once PT is discharged; in which she is currently in the ED.

## 2023-10-26 NOTE — Telephone Encounter (Signed)
Pt called and said she been having irregular heart beat. I transferred pt to nurse triage

## 2023-10-26 NOTE — ED Provider Notes (Signed)
[facility] Provider Note   CSN: [id number] Arrival date & time: 10/26/23  1441     History Chief Complaint  Patient presents with   Palpitations    HPI Virginia Fox is a 72 y.o. female presenting for chief complaint of palpitations.  States that she had a 15-minute event where she felt her heart racing.  It was radiating up into her left neck.  It lasted approximately 15 minutes and then completely resolved.  She has been waiting for 3 hours in the emergency room and has had resolution of all symptoms. Denies fevers chills nausea vomiting syncope or shortness of breath otherwise. Patient's recorded medical, surgical, social, medication list and allergies were reviewed in the Snapshot window as part of the initial history.   Review of Systems   Review of Systems  Constitutional:  Negative for chills and fever.  HENT:  Negative for ear pain and sore throat.   Eyes:  Negative for pain and visual disturbance.  Respiratory:  Negative for cough and shortness of breath.   Cardiovascular:  Positive for palpitations. Negative for chest pain.  Gastrointestinal:  Negative for abdominal pain and vomiting.  Genitourinary:  Negative for dysuria and hematuria.  Musculoskeletal:  Negative for arthralgias and back pain.  Skin:  Negative for color change and rash.  Neurological:  Negative for seizures and syncope.  All other systems reviewed and are negative.   Physical Exam Updated Vital Signs BP (!) 159/70 (BP Location: Left Arm)   Pulse 85   Temp 97.8 F (36.6 C)   Resp 20   Ht 5\' 5"  (1.651 m)   Wt 59 kg   SpO2 97%   BMI 21.63 kg/m  Physical Exam Vitals and nursing note reviewed.  Constitutional:      General: She is not in acute distress.    Appearance: She is well-developed.  HENT:     Head: Normocephalic and atraumatic.  Eyes:     Conjunctiva/sclera: Conjunctivae normal.  Cardiovascular:     Rate and Rhythm: Normal rate  and regular rhythm.     Heart sounds: No murmur heard. Pulmonary:     Effort: Pulmonary effort is normal. No respiratory distress.     Breath sounds: Normal breath sounds.  Abdominal:     Palpations: Abdomen is soft.     Tenderness: There is no abdominal tenderness.  Musculoskeletal:        General: No swelling.     Cervical back: Neck supple.  Skin:    General: Skin is warm and dry.     Capillary Refill: Capillary refill takes less than 2 seconds.  Neurological:     Mental Status: She is alert.  Psychiatric:        Mood and Affect: Mood normal.      ED Course/ Medical Decision Making/ A&P    Procedures Procedures   Medications Ordered in ED Medications - No data to display  Medical Decision Making:   72 year old female presenting with a chief complaint of palpitations that occurred earlier in the day. Third episode in the past 2 weeks.  History of ischemic colitis and other concerning medical problems. At the point of arrival it had already been 3 hours since symptoms and then she had a 3-hour wait in the lobby.  A screening ACS, hematologic, metabolic workup for underlying etiology of her symptoms were all performed.  Her EKG did not reveal any acute pathology, symptoms remained resolved during her observation  window, troponin negative.  Thyroid study is still pending. Chest x-ray did not reveal acute infectious or structural etiology of her symptoms.  Unfortunately, formal reads on x-rays have been delayed 4 to 6 hours today.  My wet read does not show any acute pathology. Will flag study for formal result but patient is requesting discharge given resolution of all symptoms.  I recommended serial troponins, monitoring on telemetry prior to discharge but given her prolonged evaluation time today she is requesting discharge.  She has expressed understanding of risk of missed disease based on limitations of today's evaluation but still requests discharge at this  time. Disposition:  I have considered need for hospitalization, however, considering all of the above, I believe this patient is stable for discharge at this time.  Patient/family educated about specific return precautions for given chief complaint and symptoms.  Patient/family educated about follow-up with PCP and cardiology.     Patient/family expressed understanding of return precautions and need for follow-up. Patient spoken to regarding all imaging and laboratory results and appropriate follow up for these results. All education provided in verbal form with additional information in written form. Time was allowed for answering of patient questions. Patient discharged.    Emergency Department Medication Summary:   Medications - No data to display      Clinical Impression:  1. Palpitations   2. Age-related osteoporosis without current pathological fracture      Discharge   Final Clinical Impression(s) / ED Diagnoses Final diagnoses:  Age-related osteoporosis without current pathological fracture  Palpitations    Rx / DC Orders ED Discharge Orders          Ordered    Ambulatory referral to Cardiology        10/26/23 1747              Glyn Ade, MD 10/26/23 1759

## 2023-10-28 ENCOUNTER — Other Ambulatory Visit: Payer: Self-pay

## 2023-10-29 ENCOUNTER — Telehealth: Payer: Self-pay

## 2023-10-29 DIAGNOSIS — M810 Age-related osteoporosis without current pathological fracture: Secondary | ICD-10-CM

## 2023-10-29 NOTE — Transitions of Care (Post Inpatient/ED Visit) (Signed)
   10/29/2023  Name: Virginia Fox MRN: [id number] DOB: 07-07-1951  Today's TOC FU Call Status:   Patient's Name and Date of Birth confirmed.  Transition Care Management Follow-up Telephone Call Date of Discharge: 10/26/23 Discharge Facility: Other [facility]) Name of Other (Non[facility]) Discharge Facility: [facility] Type of Discharge: Emergency Department Reason for ED Visit: Cardiac Conditions Cardiac Conditions Diagnosis: Atrial Fibrillation How have you been since you were released from the hospital?: Better Any questions or concerns?: No  Items Reviewed: Did you receive and understand the discharge instructions provided?: Yes Medications obtained,verified, and reconciled?: Yes (Medications Reviewed) Any new allergies since your discharge?: No Dietary orders reviewed?: No Do you have support at home?: No  Medications Reviewed Today: Medications Reviewed Today     Reviewed by Waymond Cera, CMA (Certified Medical Assistant) on 10/29/23 at 1343  Med List Status: <None>   Medication Order Taking? Sig Documenting Provider Last Dose Status Informant  Azelaic Acid 15 % gel [id number] Yes SMARTSIG:1 sparingly Topical Daily [provider] Taking Active   denosumab (PROLIA) 60 MG/ML SOSY injection [id number] Yes Inject 60 mg into the skin every 6 (six) months. [provider] Taking Active   hydrocortisone (ANUSOL-HC) 2.5 % rectal cream [id number] Yes Place 1 Application rectally 2 (two) times daily. For 7days Imogene Burn, MD Taking Active   Loratadine 10 MG CAPS [id number] Yes  [provider] Taking Active   Multiple Vitamins-Minerals (MULTIVITAMIN WITH MINERALS) tablet [id number] Yes Take 1 tablet by mouth daily. [provider] Taking Active Self  omeprazole (PRILOSEC) 40 MG capsule [id number] Yes Take 1 capsule (40 mg total) by mouth daily. Imogene Burn, MD Taking Active   Polyethyl Glyc-Propyl Glyc PF (SYSTANE ULTRA PF) 0.4-0.3 % Criss Rosales  [id number] Yes  [provider] Taking Active             Home Care and Equipment/Supplies: Were Home Health Services Ordered?: No Any new equipment or medical supplies ordered?: No  Functional Questionnaire: Do you need assistance with bathing/showering or dressing?: No Do you need assistance with meal preparation?: No Do you need assistance with eating?: No Do you have difficulty maintaining continence: No Do you need assistance with getting out of bed/getting out of a chair/moving?: No Do you have difficulty managing or taking your medications?: No  Follow up appointments reviewed: PCP Follow-up appointment confirmed?: No (declines right now) MD Provider Line [phone] Given: No Specialist Hospital Follow-up appointment confirmed?: No Do you need transportation to your follow-up appointment?: No Do you understand care options if your condition(s) worsen?: Yes-patient verbalized understanding  patient declined an appointment with PCP at this time and has been given an appointment with a Cardiologists.  SIGNATURE.Ian Bushman, cma

## 2023-10-29 NOTE — Telephone Encounter (Signed)
Prolia VOB initiated via [email or web address]  Next Prolia inj DUE: 11/11/23

## 2023-10-29 NOTE — Telephone Encounter (Signed)
Called patient for a TOC call and she asked about her Prolia and when she can schedule an appointment.   Can you please and thank you check on PA for that? Thanks. Dm/cma

## 2023-11-02 ENCOUNTER — Other Ambulatory Visit (HOSPITAL_COMMUNITY): Payer: Self-pay

## 2023-11-03 ENCOUNTER — Other Ambulatory Visit: Payer: Self-pay

## 2023-11-03 ENCOUNTER — Other Ambulatory Visit (HOSPITAL_COMMUNITY): Payer: Self-pay

## 2023-11-03 NOTE — Progress Notes (Signed)
Specialty medication Prolia must filled at MD office, patient disenrolled.

## 2023-11-03 NOTE — Telephone Encounter (Signed)
Pt ready for scheduling for Prolia on or after : 11/11/23  Out-of-pocket cost due at time of visit: $~300  Primary: [employer] - Medicare Prolia co-insurance: 20% Admin fee co-insurance: 0%  Secondary: N/A Prolia co-insurance:  Admin fee co-insurance:   Medical Benefit Details: Date Benefits were checked: 11/01/23 Deductible: no/ Coinsurance: 20%/ Admin Fee: 0%  Prior Auth: not required PA# Expiration Date:   # of doses approved:  Pharmacy benefit: Copay $200 If patient wants fill through the pharmacy benefit please send prescription to: [facility], and include estimated need by date in rx notes. Pharmacy will ship medication directly to the office.  Patient not eligible for Prolia Copay Card. Copay Card can make patient's cost as little as $25. Link to apply: [email or web address]://[email or web address]  ** This summary of benefits is an estimation of the patient's out-of-pocket cost. Exact cost may very based on individual plan coverage.

## 2023-11-18 ENCOUNTER — Ambulatory Visit: Payer: PPO | Admitting: Internal Medicine

## 2023-11-18 ENCOUNTER — Encounter: Payer: Self-pay | Admitting: Internal Medicine

## 2023-11-18 VITALS — BP 126/70 | HR 73 | Ht 64.0 in | Wt 133.2 lb

## 2023-11-18 DIAGNOSIS — K5900 Constipation, unspecified: Secondary | ICD-10-CM

## 2023-11-18 DIAGNOSIS — K649 Unspecified hemorrhoids: Secondary | ICD-10-CM | POA: Diagnosis not present

## 2023-11-18 DIAGNOSIS — Z8719 Personal history of other diseases of the digestive system: Secondary | ICD-10-CM

## 2023-11-18 DIAGNOSIS — K625 Hemorrhage of anus and rectum: Secondary | ICD-10-CM

## 2023-11-18 DIAGNOSIS — K219 Gastro-esophageal reflux disease without esophagitis: Secondary | ICD-10-CM

## 2023-11-18 NOTE — Progress Notes (Signed)
Chief Complaint: GERD, dysphagia  HPI : 72 year old female with history of GERD, hiatal hernia, anal fissure presents for follow-up of GERD, dysphagia, and constipation  She was recently hospitalized 08/29/2021 to 09/02/2021 for presumed left-sided ischemic colitis.  She had traveled to [place] and then subsequently developed bloody diarrhea at that time.  During that hospitalization, she was treated with bowel rest, IV fluids, and antibiotics.  She had significant improvement in her symptoms during the course of her hospitalization.  Interval History: She is feeling a lot better. She thinks that her stools have gotten better. The only issue that sometimes bothers her is that she has to go to have a BM 2-3 times before she feels fully evacuated. The omeprazole has helped a lot. Dysphagia has resolved, likely related to her dilation and omeprazole use. She has had some burping. No more acid reflux. Rectal bleeding has only been occasionally worse. For instance, during Thanksgiving her rectal bleeding was worse because she was more constipated. She stopped the fiber supplement because it was not helping. She is still taking probiotic. She is still drinking 1-2 cups of coffee per day. She is drinking plenty of water.   Wt Readings from Last 3 Encounters:  11/18/23 133 lb 3.2 oz (60.4 kg)  10/26/23 130 lb (59 kg)  08/12/23 128 lb (58.1 kg)    Past Medical History:  Diagnosis Date   Allergy    Anal fissure    Atherosclerosis of aorta (HCC)    seem on CT scan   Cataract    bilateral cataracts removed   GERD (gastroesophageal reflux disease)    Heart murmur    Hiatal hernia    Internal hemorrhoids    Osteoporosis      Past Surgical History:  Procedure Laterality Date   AUGMENTATION MAMMAPLASTY Bilateral    BASAL CELL CARCINOMA EXCISION     BREAST ENHANCEMENT SURGERY     CESAREAN SECTION     x 2   COLONOSCOPY     WISDOM TOOTH EXTRACTION     Family History  Problem Relation Age of  Onset   Multiple sclerosis Mother    Diabetes Father    Hypertension Father    Colon cancer Neg Hx    Esophageal cancer Neg Hx    Pancreatic cancer Neg Hx    Stomach cancer Neg Hx    Liver disease Neg Hx    Rectal cancer Neg Hx    Social History   Tobacco Use   Smoking status: Never    Passive exposure: Never   Smokeless tobacco: Never  Vaping Use   Vaping status: Never Used  Substance Use Topics   Alcohol use: Not Currently    Comment: socially   Drug use: No   Current Outpatient Medications  Medication Sig Dispense Refill   Azelaic Acid 15 % gel SMARTSIG:1 sparingly Topical Daily     denosumab (PROLIA) 60 MG/ML SOSY injection Inject 60 mg into the skin every 6 (six) months.     Multiple Vitamins-Minerals (MULTIVITAMIN WITH MINERALS) tablet Take 1 tablet by mouth daily.     omeprazole (PRILOSEC) 40 MG capsule Take 1 capsule (40 mg total) by mouth daily. 30 capsule 3   Polyethyl Glyc-Propyl Glyc PF (SYSTANE ULTRA PF) 0.4-0.3 % SOLN      Loratadine 10 MG CAPS  (Patient not taking: Reported on 11/18/2023)     No current facility-administered medications for this visit.   Allergies  Allergen Reactions   Atorvastatin Other (See Comments)  Myalgias.     Physical Exam: BP 126/70   Pulse 73   Ht 5\' 4"  (1.626 m)   Wt 133 lb 3.2 oz (60.4 kg)   SpO2 97%   BMI 22.86 kg/m  Constitutional: Pleasant,well-developed, female in no acute distress. HEENT: Normocephalic and atraumatic. Conjunctivae are normal. No scleral icterus. Cardiovascular: Normal rate, regular rhythm.  Pulmonary/chest: Effort normal and breath sounds normal. No wheezing, rales or rhonchi. Abdominal: Soft, nondistended, nontender. Bowel sounds active throughout. There are no masses palpable. No hepatomegaly. Extremities: No edema Neurological: Alert and oriented to person place and time. Skin: Skin is warm and dry. No rashes noted. Psychiatric: Normal mood and affect. Behavior is normal.  Labs 09/2021:  CBC and CMP were unremarkable  CT A/P w/contrast 08/29/21: IMPRESSION: 1. Left-sided colitis, with appearance and distribution which favor ischemia. Infectious and inflammatory etiologies felt less likely. 2. Small volume right pelvic and cul-de-sac fluid, likely secondary. 3.  Tiny hiatal hernia. 4.  Aortic Atherosclerosis (ICD10-I70.0). 5. 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  Colonoscopy 12/16/21:  Path: Surgical [P], left sided colon biopsy FOCAL ACUTE COLITIS/CRYPTITIS WITH MINIMAL ARCHITECTURAL CHANGES Microscopic Comment Sections show multiple fragments of colonic mucosa exhibiting focal minimal architectural distortion in the form of focal crypt budding. The lamina propria exhibits the normal complement of mononuclear cells with a very focal neutrophilic infiltrate centered around a single crypt. There is no increase in intraepithelial lymphocytes and the collagen table is of normal thickness. No granulomas or parasites are seen. No foreshortening or basal plasmacytosis is evident. There is no evidence of dysplasia or carcinoma. The architectural and inflammatory changes are very focal and nonspecific. The histologic changes of focal active colitis have been most commonly associated with a resolving acute self-limited colitis; however, certain drugs particularly NSAIDs may elicit similar changes. The focal architectural changes may be associated with diverticular disease or possibly a quiescent/minimally active chronic colitis (not favored). Clinical, endoscopic and microbiologic correlation is recommended.  EGD 08/12/23: - Benign- appearing esophageal stenosis. Dilated. Biopsied. - Normal stomach. - Normal examined duodenum. - Biopsies were taken with a cold forceps for histology in the proximal esophagus and in the distal esophagus. Path: 1. Surgical [P], esophageal - SQUAMOUS MUCOSA WITH PAPILLARY ELONGATION AND BASAL CELL HYPERPLASIA SUGGESTIVE OF  REFLUX. 2. Surgical [P], esophageal - SQUAMOUS MUCOSA WITH MILD BASAL CELL HYPERPLASIA.  Flex sig 08/12/23: - Irritated internal hemorrhoids. - The examination was otherwise normal. - Biopsies were taken with a cold forceps from the left transverse colon, descending colon, sigmoid colon and rectum for evaluation of microscopic colitis. Path: 3. Surgical [P], left colon bx - COLONIC MUCOSA WITH NO SIGNIFICANT PATHOLOGY.  ASSESSMENT AND PLAN: Dysphagia - resolved GERD - improved Constipation Rectal bleeding History of ischemic colitis Hemorrhoids Patient presents for follow-up of acid reflux, dysphagia, constipation, and hemorrhoids.  Overall she is doing much better.  Her acid reflux and dysphagia have resolved on omeprazole therapy and after balloon dilation.  I did go over some of the long-term side effects of omeprazole including decreased bone density, risk of C. difficile infection, electrolyte abnormalities, and kidney dysfunction.  However patient is feeling better on the omeprazole and thus wishes to continue on the 40 mg dosing at this time.  Can potentially decrease to 20 mg in the future. Patient does continue to have occasional episodes of constipation and does not feel like she fully evacuates until she has 2-3 bowel movements per day.  Thus I asked her  to take MiraLAX once a day to see if this helps to induce more substantial bowel movements and helps with her constipation.  Patient's rectal bleeding is okay at this time.  Her Anusol HC cream did help, and I gave her some information on hemorrhoidal banding in case she may be interested in this in the future.  On her most recent flexible sigmoidoscopy, this demonstrated that her prior ischemic colitis had resolved.   - Start daily dose of Miralax - Cont omeprazole 40 mg every day. Refill - Okay to use Anusol HC cream. If not effective, can consider hemorrhoidal banding. - RTC 3 months  Eulah Pont, MD   I spent 36 minutes of  time, including in depth chart review, independent review of results as outlined above, communicating results with the patient directly, face-to-face time with the patient, coordinating care, ordering studies and medications as appropriate, and documentation.

## 2023-11-18 NOTE — Patient Instructions (Addendum)
We have sent the following medications to your pharmacy for you to pick up at your convenience: Omeprazole  Start taking Miralax daily   You are schedule for a follow up visit on 02/21/24 at 3 pm  If your blood pressure at your visit was 140/90 or greater, please contact your primary care physician to follow up on this.  _______________________________________________________  If you are age 74 or older, your body mass index should be between 23-30. Your Body mass index is 22.86 kg/m. If this is out of the aforementioned range listed, please consider follow up with your Primary Care Provider.  If you are age 8 or younger, your body mass index should be between 19-25. Your Body mass index is 22.86 kg/m. If this is out of the aformentioned range listed, please consider follow up with your Primary Care Provider.   ________________________________________________________  The [facility] providers would like to encourage you to use [facility] to communicate with providers for non-urgent requests or questions.  Due to long hold times on the telephone, sending your provider a message by [facility] may be a faster and more efficient way to get a response.  Please allow 48 business hours for a response.  Please remember that this is for non-urgent requests.  _______________________________________________________  Thank you for entrusting me with your care and for choosing [facility], Dr. Eulah Pont

## 2023-11-19 ENCOUNTER — Ambulatory Visit
Admission: RE | Admit: 2023-11-19 | Discharge: 2023-11-19 | Disposition: A | Discharge status: Home / Self Care | Payer: PPO | Source: Ambulatory Visit | Attending: Family Medicine | Admitting: Family Medicine

## 2023-11-19 DIAGNOSIS — Z1231 Encounter for screening mammogram for malignant neoplasm of breast: Secondary | ICD-10-CM | POA: Diagnosis not present

## 2023-11-22 NOTE — Telephone Encounter (Signed)
Pt is calling wanting to know when she will be able to schedule her Prolia injection. Pt at [phone]

## 2023-11-24 ENCOUNTER — Other Ambulatory Visit: Payer: Self-pay | Admitting: Family Medicine

## 2023-11-24 DIAGNOSIS — M810 Age-related osteoporosis without current pathological fracture: Secondary | ICD-10-CM

## 2023-11-24 MED ORDER — PROLIA 60 MG/ML ~~LOC~~ SOSY: 60.0000 mg | PREFILLED_SYRINGE | SUBCUTANEOUS | 0 refills | Status: DC

## 2023-11-24 NOTE — Addendum Note (Signed)
Addended by: Larey Dresser on: 11/24/2023 12:32 PM   Modules accepted: Orders

## 2023-11-26 ENCOUNTER — Other Ambulatory Visit (HOSPITAL_COMMUNITY): Payer: Self-pay

## 2023-11-26 NOTE — Addendum Note (Signed)
Addended by: Larey Dresser on: 11/26/2023 09:58 AM   Modules accepted: Orders

## 2023-11-29 ENCOUNTER — Other Ambulatory Visit (HOSPITAL_COMMUNITY): Payer: Self-pay

## 2023-11-29 ENCOUNTER — Other Ambulatory Visit: Payer: Self-pay

## 2023-11-29 MED ORDER — DENOSUMAB 60 MG/ML ~~LOC~~ SOSY
60.0000 mg | PREFILLED_SYRINGE | Freq: Once | SUBCUTANEOUS | 0 refills | Status: DC
  Filled 2023-11-29: qty 1, 1d supply, fill #0

## 2023-11-29 NOTE — Addendum Note (Signed)
Addended by: Larey Dresser on: 11/29/2023 12:18 PM   Modules accepted: Orders

## 2023-11-29 NOTE — Addendum Note (Signed)
Addended by: Larey Dresser on: 11/29/2023 12:23 PM   Modules accepted: Orders

## 2023-12-09 ENCOUNTER — Other Ambulatory Visit: Payer: Self-pay

## 2023-12-09 NOTE — Telephone Encounter (Signed)
 Spoke with Raimon with [facility], states the medication will need to be ordered as [employer] (Medical Benefit).

## 2023-12-10 NOTE — Telephone Encounter (Signed)
 Prolia received in office from [employer] - this medication IS NOT patient-supplied.

## 2023-12-16 ENCOUNTER — Ambulatory Visit: Payer: PPO

## 2023-12-16 DIAGNOSIS — M810 Age-related osteoporosis without current pathological fracture: Secondary | ICD-10-CM

## 2023-12-16 MED ORDER — DENOSUMAB 60 MG/ML ~~LOC~~ SOSY
60.0000 mg | PREFILLED_SYRINGE | Freq: Once | SUBCUTANEOUS | Status: DC
  Administered 2023-12-16: 60 mg via SUBCUTANEOUS

## 2023-12-16 MED ORDER — DENOSUMAB 60 MG/ML ~~LOC~~ SOSY: 60.0000 mg | PREFILLED_SYRINGE | Freq: Once | SUBCUTANEOUS | 0 refills | Status: DC

## 2023-12-16 NOTE — Progress Notes (Signed)
 Per orders of Dr Doreene Burke, injection of Prolia given in  LT arm by [facility], cma.  Patient tolerated injection well.  She will return in 6 months for her next one.  Dm/cma

## 2023-12-17 ENCOUNTER — Telehealth: Payer: Self-pay

## 2023-12-17 ENCOUNTER — Other Ambulatory Visit (HOSPITAL_COMMUNITY): Payer: Self-pay

## 2023-12-17 NOTE — Telephone Encounter (Signed)
 Prolia VOB initiated via [email or web address]

## 2023-12-20 ENCOUNTER — Other Ambulatory Visit (HOSPITAL_COMMUNITY): Payer: Self-pay

## 2023-12-20 NOTE — Telephone Encounter (Signed)
 Pt ready for scheduling for PROLIA  on or after : 12/16/23  Out-of-pocket cost due at time of visit: $323  Number of injection/visits approved: ---  Primary: [employer] Prolia  co-insurance: 20% Admin fee co-insurance: 0%  Secondary: --- Prolia  co-insurance:  Admin fee co-insurance:   Medical Benefit Details: Date Benefits were checked: 12/17/23 Deductible: NO/ Coinsurance: 20%/ Admin Fee: 0%  Prior Auth: N/A PA# Expiration Date:   # of doses approved:  Pharmacy benefit: Copay $250 If patient wants fill through the pharmacy benefit please send prescription to: [facility], and include estimated need by date in rx notes. Pharmacy will ship medication directly to the office.  Patient NOT eligible for Prolia  Copay Card. Copay Card can make patient's cost as little as $25. Link to apply: [email or web address]://www.[email or web address]  ** This summary of benefits is an estimation of the patient's out-of-pocket cost. Exact cost may very based on individual plan coverage.

## 2023-12-29 MED ORDER — DENOSUMAB 60 MG/ML ~~LOC~~ SOSY
60.0000 mg | PREFILLED_SYRINGE | Freq: Once | SUBCUTANEOUS | Status: AC
  Administered 2024-06-22: 60 mg via SUBCUTANEOUS

## 2023-12-29 NOTE — Progress Notes (Signed)
LOT [id number] NDC [id number] Exp: 06/05/2026

## 2023-12-29 NOTE — Addendum Note (Signed)
Addended by: Larey Dresser on: 12/29/2023 01:47 PM   Modules accepted: Orders

## 2024-01-03 ENCOUNTER — Ambulatory Visit: Payer: PPO | Attending: Cardiology | Admitting: Cardiology

## 2024-01-03 ENCOUNTER — Encounter: Payer: Self-pay | Admitting: Cardiology

## 2024-01-03 VITALS — BP 140/70 | HR 78 | Resp 16 | Ht 64.0 in | Wt 132.6 lb

## 2024-01-03 DIAGNOSIS — E782 Mixed hyperlipidemia: Secondary | ICD-10-CM | POA: Diagnosis not present

## 2024-01-03 DIAGNOSIS — R002 Palpitations: Secondary | ICD-10-CM | POA: Diagnosis not present

## 2024-01-03 NOTE — Patient Instructions (Addendum)
Lab Work: Today: Lipid panel If you have labs (blood work) drawn today and your tests are completely normal, you will receive your results only by: MyChart Message (if you have MyChart) OR A paper copy in the mail If you have any lab test that is abnormal or we need to change your treatment, we will call you to review the results.   Testing/Procedures: Your physician has requested that you have a coronary calcium score performed. This is not covered by insurance and will be an out-of-pocket cost of approximately $99.     Your physician has recommended that you wear an event monitor. Event monitors are medical devices that record the heart's electrical activity. Doctors most often [place] these monitors to diagnose arrhythmias. Arrhythmias are problems with the speed or rhythm of the heartbeat. The monitor is a small, portable device. You can wear one while you do your normal daily activities. This is usually used to diagnose what is causing palpitations/syncope (passing out).   Please make an appointment same day as echo to have event monitor placed    Your physician has requested that you have an echocardiogram. Echocardiography is a painless test that uses sound waves to create images of your heart. It provides your doctor with information about the size and shape of your heart and how well your heart's chambers and valves are working. This procedure takes approximately one hour. There are no restrictions for this procedure. Please do NOT wear cologne, perfume, aftershave, or lotions (deodorant is allowed). Please arrive 15 minutes prior to your appointment time.  Please note: We ask at that you not bring children with you during ultrasound (echo/ vascular) testing. Due to room size and safety concerns, children are not allowed in the ultrasound rooms during exams. Our front office staff cannot provide observation of children in our lobby area while testing is being conducted. An adult accompanying  a patient to their appointment will only be allowed in the ultrasound room at the discretion of the ultrasound technician under special circumstances. We apologize for any inconvenience.    Follow-Up: At [facility], you and your health needs are our priority.  As part of our continuing mission to provide you with exceptional heart care, we have created designated Provider Care Teams.  These Care Teams include your primary Cardiologist (physician) and Advanced Practice Providers (APPs -  Physician Assistants and Nurse Practitioners) who all work together to provide you with the care you need, when you need it.  We recommend signing up for the patient portal called "MyChart".  Sign up information is provided on this After Visit Summary.  MyChart is used to connect with patients for Virtual Visits (Telemedicine).  Patients are able to view lab/test results, encounter notes, upcoming appointments, etc.  Non-urgent messages can be sent to your provider as well.   To learn more about what you can do with MyChart, go to [email or web address].    Your next appointment:   3 month(s)  Provider:   Elder Negus, MD     Other Instructions   1st Floor: - Lobby - Registration  - Pharmacy  - Lab - Cafe  2nd Floor: - PV Lab - Diagnostic Testing (echo, CT, nuclear med)  3rd Floor: - Vacant  4th Floor: - TCTS (cardiothoracic surgery) - AFib Clinic - Structural Heart Clinic - Vascular Surgery  - Vascular Ultrasound  5th Floor: - HeartCare Cardiology (general and EP) - Clinical Pharmacy for coumadin, hypertension, lipid, weight-loss medications, and med management  appointments    Valet parking services will be available as well.

## 2024-01-03 NOTE — Progress Notes (Signed)
Cardiology Office Note:  .   Date:  01/03/2024  ID:  Virginia, Fox September 01, 1951, MRN [id number] PCP: Mliss Sax, MD  [facility] Providers Cardiologist:  Truett Mainland, MD PCP: Mliss Sax, MD  Chief Complaint  Patient presents with   Palpitations   Hyperlipidemia   New Patient (Initial Visit)      History of Present Illness: .    Virginia Fox is a 73 y.o. female with palpitations, irregular heartbeat  Patient states active with regular walking, except when it is too cold outside. Patient was seen in emergency room on 10/26/2023 with complaints of palpitations.  ACS was excluded.  She had these episodes of palpitations couple times few days apart, lasting for up to 2-3 hours, sometimes preceded by suspected dehydration.  However, there was no relieving factor noted for the palpitations.  By the time she reached ER, her symptoms are resolved.  She had 1 episode of palpitations lasting for several minutes since then.  Separately, patient had ischemic colitis in 2022 without was conservatively managed.  No specific etiology was identified for ischemic colitis.  Blood pressure is elevated today.  She does not check blood pressure regularly at home.  Lipid panel was last checked in 2022.  She is currently not on any lipid-lowering therapy.  She did have severe myalgias with at least 2 statins, including atorvastatin  Vitals:   01/03/24 1349  Pulse: 78  Resp: 16  SpO2: 98%     ROS:  Review of Systems  Cardiovascular:  Positive for palpitations. Negative for chest pain, dyspnea on exertion, leg swelling and syncope.     Studies Reviewed: Marland Kitchen        EKG 01/03/2024: Normal sinus rhythm Possible Left atrial enlargement When compared with ECG of 26-Oct-2023 14:49, No significant change was found  Independently interpreted 10/2023: Hb 11.8 Cr 0.67. Na 134 Trio HS 3 TSH 0.96  CT cardiac scoring 04/2022: Calcium score  0  05/2021: Chol 210, TG 48, HDL 76, LDL 124    Physical Exam:   Physical Exam Vitals and nursing note reviewed.  Constitutional:      General: She is not in acute distress. Neck:     Vascular: No JVD.  Cardiovascular:     Rate and Rhythm: Normal rate and regular rhythm.     Heart sounds: Normal heart sounds. No murmur heard. Pulmonary:     Effort: Pulmonary effort is normal.     Breath sounds: Normal breath sounds. No wheezing or rales.  Musculoskeletal:     Right lower leg: No edema.     Left lower leg: No edema.      VISIT DIAGNOSES:   ICD-10-CM   1. Palpitations  R00.2 EKG 12-Lead    Cardiac event monitor    ECHOCARDIOGRAM COMPLETE    2. Mixed hyperlipidemia  E78.2 Lipid panel    CT CARDIAC SCORING (SELF PAY ONLY)    Lipid panel       ASSESSMENT AND PLAN: .    Virginia Fox is a 73 y.o. female with palpitations, irregular heartbeat, mixed hyperlipidemia  Palpitations, irregular beat: Symptoms lasting for several hours, concerning for atrial fibrillation.  She has history of ischemic colitis in 2022, without any clear precipitating cause.  This also raises suspicion for atrial fibrillation. Recommend echocardiogram and 30-day event monitor. Further recommendations after above testing.  Mixed hyperlipidemia: History of statin myalgias with 2 statins including atorvastatin. Check lipid panel and calcium score now. Further  recommendations after above testing. Given statin myalgia, we may need to look into nonstatin therapy.       No orders of the defined types were placed in this encounter.    F/u in 3 months  Signed, Elder Negus, MD

## 2024-01-04 LAB — LIPID PANEL
Chol/HDL Ratio: 2.8 {ratio} (ref 0.0–4.4)
Cholesterol, Total: 219 mg/dL — ABNORMAL HIGH (ref 100–199)
HDL: 78 mg/dL (ref >39)
LDL Chol Calc (NIH): 131 mg/dL — ABNORMAL HIGH (ref 0–99)
Triglycerides: 56 mg/dL (ref 0–149)
VLDL Cholesterol Cal: 10 mg/dL (ref 5–40)

## 2024-01-04 NOTE — Progress Notes (Signed)
Cholesterol is elevated. Known statin myalgias. Let us await calcium score scan before discussing appropriate non-statin lipid lowering therapy.  Thanks MJP

## 2024-01-05 ENCOUNTER — Encounter: Payer: Self-pay | Admitting: *Deleted

## 2024-01-13 ENCOUNTER — Telehealth: Payer: Self-pay | Admitting: *Deleted

## 2024-01-13 NOTE — Telephone Encounter (Signed)
 Patient had monitor appointment scheduled on 01/17/23 at 4:00PM after her echocardiogram.  Unfortunately our second tech will be out of the office, causing the monitor/treadmill schedule to be doublebooked. I will be moving your monitor appointment to 01/17/23 at 4:30 PM.  If the 3:30 treadmill patient does not show up for their appointment , I would be able to take you back earlier. Please call Jacci Ruberg in monitors at [phone] if this will not work for you.

## 2024-01-17 ENCOUNTER — Ambulatory Visit (HOSPITAL_COMMUNITY)
Admission: RE | Admit: 2024-01-17 | Discharge: 2024-01-17 | Disposition: A | Discharge status: Home / Self Care | Payer: Self-pay | Source: Ambulatory Visit | Attending: Cardiology | Admitting: Cardiology

## 2024-01-17 ENCOUNTER — Telehealth: Payer: Self-pay | Admitting: Cardiology

## 2024-01-17 DIAGNOSIS — E782 Mixed hyperlipidemia: Secondary | ICD-10-CM

## 2024-01-17 DIAGNOSIS — Z79899 Other long term (current) drug therapy: Secondary | ICD-10-CM

## 2024-01-17 DIAGNOSIS — I2510 Atherosclerotic heart disease of native coronary artery without angina pectoris: Secondary | ICD-10-CM

## 2024-01-17 MED ORDER — EZETIMIBE 10 MG PO TABS: 10.0000 mg | ORAL_TABLET | Freq: Every day | ORAL | 3 refills | Status: DC

## 2024-01-17 NOTE — Telephone Encounter (Signed)
-----   Message from [facility] sent at 01/17/2024  4:12 PM EST ----- Very minimal amount of calcium  and 1 heart artery. She has not tolerated statins in the past. We could try Zetia  10 mg daily. Repeat blood panel in 3 months  Thanks MJP

## 2024-01-17 NOTE — Telephone Encounter (Signed)
 The patient has been notified of the result and verbalized understanding.  All questions (if any) were answered.  Pt aware to start taking zetia  10 mg po daily and come in for repeat lipids in 3 months.  Confirmed the pharmacy of choice with the pt.   3 month lipids placed and released in the system.  Pt aware to make note on her calendar that it would be around 2nd week in May to drop by the lab to have this done.  Pt is aware to go fasting to this lab appt.   Pt verbalized understanding and agrees with this plan.

## 2024-01-17 NOTE — Telephone Encounter (Signed)
 Patient is returning call to discuss CT results.

## 2024-01-17 NOTE — Progress Notes (Signed)
 Very minimal amount of calcium  and 1 heart artery. She has not tolerated statins in the past. We could try Zetia  10 mg daily. Repeat blood panel in 3 months  Thanks MJP

## 2024-01-20 ENCOUNTER — Ambulatory Visit: Payer: PPO | Attending: Cardiology

## 2024-01-20 ENCOUNTER — Ambulatory Visit (INDEPENDENT_AMBULATORY_CARE_PROVIDER_SITE_OTHER): Payer: PPO

## 2024-01-20 ENCOUNTER — Encounter: Payer: Self-pay | Admitting: Cardiology

## 2024-01-20 ENCOUNTER — Ambulatory Visit: Payer: PPO

## 2024-01-20 DIAGNOSIS — R002 Palpitations: Secondary | ICD-10-CM

## 2024-01-20 DIAGNOSIS — I700 Atherosclerosis of aorta: Secondary | ICD-10-CM | POA: Insufficient documentation

## 2024-01-20 DIAGNOSIS — I5030 Unspecified diastolic (congestive) heart failure: Secondary | ICD-10-CM | POA: Diagnosis not present

## 2024-01-20 DIAGNOSIS — E785 Hyperlipidemia, unspecified: Secondary | ICD-10-CM | POA: Diagnosis not present

## 2024-01-20 LAB — ECHOCARDIOGRAM COMPLETE
Area-P 1/2: 5.34 cm2
S' Lateral: 2.7 cm

## 2024-01-20 NOTE — Progress Notes (Unsigned)
[employer] event monitor serial [id number] from office inventory applied to patient.  Dr. Rosemary Holms to read.

## 2024-01-24 ENCOUNTER — Telehealth: Payer: Self-pay | Admitting: Cardiology

## 2024-01-24 ENCOUNTER — Telehealth: Payer: Self-pay

## 2024-01-24 MED ORDER — APIXABAN 5 MG PO TABS: 5.0000 mg | ORAL_TABLET | Freq: Two times a day (BID) | ORAL | 3 refills | Status: DC

## 2024-01-24 NOTE — Telephone Encounter (Signed)
Pt called in, returning Demetris call.    Best number [phone]

## 2024-01-24 NOTE — Telephone Encounter (Signed)
Event occurred on 2/16 at 420 pm NSR with new onset of Atrial flutter hr 72

## 2024-01-24 NOTE — Telephone Encounter (Signed)
Spoke with patient and she is aware to start Eliquis 5 mg BID.   Eliquis sent to pharmacy.  Free 30 day trial given to [facility] pharmacy [facility])

## 2024-01-24 NOTE — Telephone Encounter (Signed)
  Mai calling to give abnormally result

## 2024-01-24 NOTE — Telephone Encounter (Signed)
   Cardiac Monitor Alert  Date of alert:  01/24/2024   Patient Name: Virginia Fox  DOB: August 23, 1951  MRN: [id number]   [facility] Cardiologist: Elder Negus, MD  [facility] EP:  None    Monitor Information: Cardiac Event Monitor [[employer]]  Reason:  Palpitations Ordering provider:  DR. Rosemary Holms    Alert Atrial Fibrillation/Flutter This is the 1st alert for this rhythm.  The patient has no hx of Atrial Fibrillation/Flutter.  The patient is not currently on anticoagulation.  Next Cardiology Appointment   Date:  03/07/2024  Provider:  Dr. Rosemary Holms  The patient was contacted today.  She is asymptomatic. Arrhythmia, symptoms and history reviewed with Dr. Anne Fu.  Plan:  Start Eliquis 5 mg BID    Other: Continue to wear monitor  Virginia Catapano Merilynn Finland, LPN  03/15/8118 14:78 PM

## 2024-01-24 NOTE — Telephone Encounter (Signed)
 Spoke with patient and she is aware of echo results. She verbalized understanding

## 2024-01-24 NOTE — Telephone Encounter (Signed)
 Left voicemail to return call to office

## 2024-01-25 NOTE — Telephone Encounter (Signed)
 Noted.  Thanks MJP

## 2024-01-30 ENCOUNTER — Observation Stay (HOSPITAL_BASED_OUTPATIENT_CLINIC_OR_DEPARTMENT_OTHER)
Admission: EM | Admit: 2024-01-30 | Discharge: 2024-01-31 | Disposition: A | Discharge status: Home / Self Care | Payer: PPO | Attending: Internal Medicine | Admitting: Internal Medicine

## 2024-01-30 ENCOUNTER — Other Ambulatory Visit: Payer: Self-pay

## 2024-01-30 ENCOUNTER — Encounter (HOSPITAL_BASED_OUTPATIENT_CLINIC_OR_DEPARTMENT_OTHER): Payer: Self-pay | Admitting: Emergency Medicine

## 2024-01-30 DIAGNOSIS — I4891 Unspecified atrial fibrillation: Principal | ICD-10-CM | POA: Diagnosis present

## 2024-01-30 DIAGNOSIS — E782 Mixed hyperlipidemia: Secondary | ICD-10-CM | POA: Diagnosis not present

## 2024-01-30 DIAGNOSIS — J449 Chronic obstructive pulmonary disease, unspecified: Secondary | ICD-10-CM | POA: Diagnosis not present

## 2024-01-30 DIAGNOSIS — Z7901 Long term (current) use of anticoagulants: Secondary | ICD-10-CM | POA: Diagnosis not present

## 2024-01-30 DIAGNOSIS — Z79899 Other long term (current) drug therapy: Secondary | ICD-10-CM | POA: Diagnosis not present

## 2024-01-30 DIAGNOSIS — D649 Anemia, unspecified: Secondary | ICD-10-CM | POA: Diagnosis present

## 2024-01-30 DIAGNOSIS — R000 Tachycardia, unspecified: Secondary | ICD-10-CM | POA: Diagnosis present

## 2024-01-30 DIAGNOSIS — K219 Gastro-esophageal reflux disease without esophagitis: Secondary | ICD-10-CM | POA: Diagnosis not present

## 2024-01-30 DIAGNOSIS — D72829 Elevated white blood cell count, unspecified: Secondary | ICD-10-CM | POA: Diagnosis present

## 2024-01-30 MED ORDER — SODIUM CHLORIDE 0.9 % IV BOLUS
1000.0000 mL | Freq: Once | INTRAVENOUS | Status: AC
  Administered 2024-01-30: 1000 mL via INTRAVENOUS

## 2024-01-30 NOTE — ED Provider Notes (Signed)
 [place] [facility] Provider Note  CSN: [id number] Arrival date & time: 01/30/24 2225  Chief Complaint(s) Tachycardia  HPI Virginia Fox is a 73 y.o. female {Add pertinent medical, surgical, social history, OB history to HPI:1}   HPI  Past Medical History Past Medical History:  Diagnosis Date   Allergy    Anal fissure    Atherosclerosis of aorta (HCC)    seem on CT scan   Cataract    bilateral cataracts removed   GERD (gastroesophageal reflux disease)    Heart murmur    Hiatal hernia    Internal hemorrhoids    Osteoporosis    Patient Active Problem List   Diagnosis Date Noted   Palpitations 01/03/2024   Mixed hyperlipidemia 01/03/2024   Myalgia due to statin 07/09/2022   Vitamin D overdose 07/09/2022   Intolerance of drug 04/07/2022   At risk for coronary artery disease 04/07/2022   Flu vaccine need 09/17/2021   Atherosclerosis of aorta (HCC) 09/17/2021   Ischemic colitis (HCC) 08/29/2021   Age-related osteoporosis without current pathological fracture 12/18/2020   Home Medication(s) Prior to Admission medications   Medication Sig Start Date End Date Taking? Authorizing Provider  apixaban (ELIQUIS) 5 MG TABS tablet Take 1 tablet (5 mg total) by mouth 2 (two) times daily. 01/24/24   Jake Bathe, MD  Azelaic Acid 15 % gel SMARTSIG:1 sparingly Topical Daily 03/11/22   [provider]  ezetimibe (ZETIA) 10 MG tablet Take 1 tablet (10 mg total) by mouth daily. 01/17/24   Elder Negus, MD  Loratadine 10 MG CAPS  12/07/16   [provider]  Multiple Vitamins-Minerals (MULTIVITAMIN WITH MINERALS) tablet Take 1 tablet by mouth daily.    [provider]  omeprazole (PRILOSEC) 20 MG capsule Take 20 mg by mouth daily.    [provider]  Polyethyl Glyc-Propyl Glyc PF (SYSTANE ULTRA PF) 0.4-0.3 % SOLN     [provider]                                                                                                                                     Allergies Atorvastatin  Review of Systems Review of Systems As noted in HPI  Physical Exam Vital Signs  I have reviewed the triage vital signs BP (!) 159/86 (BP Location: Right Arm)   Pulse (!) 115   Temp 97.6 F (36.4 C)   Resp 16   Ht 5\' 5"  (1.651 m)   Wt 59 kg   SpO2 100%   BMI 21.63 kg/m  *** Physical Exam  ED Results and Treatments Labs (all labs ordered are listed, but only abnormal results are displayed) Labs Reviewed  MAGNESIUM  CBC  TSH  COMPREHENSIVE METABOLIC PANEL  EKG  EKG Interpretation Date/Time:    Ventricular Rate:    PR Interval:    QRS Duration:    QT Interval:    QTC Calculation:   R Axis:      Text Interpretation:         Radiology No results found.  Medications Ordered in ED Medications  sodium chloride 0.9 % bolus 1,000 mL (has no administration in time range)   Procedures Procedures  (including critical care time) Medical Decision Making / ED Course   Medical Decision Making Amount and/or Complexity of Data Reviewed Labs: ordered.    *** Clinical Course as of 01/30/24 2320  Sun Jan 30, 2024  2316 1830p start time 1 week of Eliquis  [PC]    Clinical Course User Index [PC] Rawlin Reaume, Amadeo Garnet, MD    Final Clinical Impression(s) / ED Diagnoses Final diagnoses:  None    This chart was dictated using voice recognition software.  Despite best efforts to proofread,  errors can occur which can change the documentation meaning.

## 2024-01-30 NOTE — Telephone Encounter (Signed)
 Patient called and is having HR in 120s. She said she was going to ED. I agreed to go and get ECG.

## 2024-01-30 NOTE — ED Triage Notes (Signed)
 Pt reports elevated HR x 3 hrs; currently wearing heart monitor for same; denies pain/SHOB

## 2024-01-31 ENCOUNTER — Emergency Department (HOSPITAL_BASED_OUTPATIENT_CLINIC_OR_DEPARTMENT_OTHER): Payer: PPO

## 2024-01-31 DIAGNOSIS — E782 Mixed hyperlipidemia: Secondary | ICD-10-CM

## 2024-01-31 DIAGNOSIS — J449 Chronic obstructive pulmonary disease, unspecified: Secondary | ICD-10-CM | POA: Diagnosis not present

## 2024-01-31 DIAGNOSIS — I480 Paroxysmal atrial fibrillation: Secondary | ICD-10-CM

## 2024-01-31 DIAGNOSIS — K219 Gastro-esophageal reflux disease without esophagitis: Secondary | ICD-10-CM

## 2024-01-31 DIAGNOSIS — D72829 Elevated white blood cell count, unspecified: Secondary | ICD-10-CM | POA: Diagnosis present

## 2024-01-31 DIAGNOSIS — I4891 Unspecified atrial fibrillation: Secondary | ICD-10-CM | POA: Diagnosis not present

## 2024-01-31 DIAGNOSIS — D649 Anemia, unspecified: Secondary | ICD-10-CM

## 2024-01-31 LAB — CBC WITH DIFFERENTIAL/PLATELET
Abs Immature Granulocytes: 0.04 10*3/uL (ref 0.00–0.07)
Basophils Absolute: 0.1 10*3/uL (ref 0.0–0.1)
Basophils Relative: 1 %
Eosinophils Absolute: 0 10*3/uL (ref 0.0–0.5)
Eosinophils Relative: 0 %
HCT: 35.7 % — ABNORMAL LOW (ref 36.0–46.0)
Hemoglobin: 11.4 g/dL — ABNORMAL LOW (ref 12.0–15.0)
Immature Granulocytes: 0 %
Lymphocytes Relative: 21 %
Lymphs Abs: 1.9 10*3/uL (ref 0.7–4.0)
MCH: 29.9 pg (ref 26.0–34.0)
MCHC: 31.9 g/dL (ref 30.0–36.0)
MCV: 93.7 fL (ref 80.0–100.0)
Monocytes Absolute: 1.3 10*3/uL — ABNORMAL HIGH (ref 0.1–1.0)
Monocytes Relative: 14 %
Neutro Abs: 5.9 10*3/uL (ref 1.7–7.7)
Neutrophils Relative %: 64 %
Platelets: 218 10*3/uL (ref 150–400)
RBC: 3.81 MIL/uL — ABNORMAL LOW (ref 3.87–5.11)
RDW: 13.1 % (ref 11.5–15.5)
WBC: 9.3 10*3/uL (ref 4.0–10.5)
nRBC: 0 % (ref 0.0–0.2)

## 2024-01-31 LAB — CBC
HCT: 40.3 % (ref 36.0–46.0)
Hemoglobin: 12.8 g/dL (ref 12.0–15.0)
MCH: 29.6 pg (ref 26.0–34.0)
MCHC: 31.8 g/dL (ref 30.0–36.0)
MCV: 93.3 fL (ref 80.0–100.0)
Platelets: 273 10*3/uL (ref 150–400)
RBC: 4.32 MIL/uL (ref 3.87–5.11)
RDW: 12.8 % (ref 11.5–15.5)
WBC: 11.3 10*3/uL — ABNORMAL HIGH (ref 4.0–10.5)
nRBC: 0 % (ref 0.0–0.2)

## 2024-01-31 LAB — BASIC METABOLIC PANEL
Anion gap: 7 (ref 5–15)
BUN: 12 mg/dL (ref 8–23)
CO2: 22 mmol/L (ref 22–32)
Calcium: 8.9 mg/dL (ref 8.9–10.3)
Chloride: 109 mmol/L (ref 98–111)
Creatinine, Ser: 0.66 mg/dL (ref 0.44–1.00)
GFR, Estimated: >60 mL/min (ref >60)
Glucose, Bld: 87 mg/dL (ref 70–99)
Potassium: 4.1 mmol/L (ref 3.5–5.1)
Sodium: 138 mmol/L (ref 135–145)

## 2024-01-31 LAB — COMPREHENSIVE METABOLIC PANEL
ALT: 19 U/L (ref 0–44)
AST: 26 U/L (ref 15–41)
Albumin: 4.5 g/dL (ref 3.5–5.0)
Alkaline Phosphatase: 73 U/L (ref 38–126)
Anion gap: 12 (ref 5–15)
BUN: 18 mg/dL (ref 8–23)
CO2: 22 mmol/L (ref 22–32)
Calcium: 9.5 mg/dL (ref 8.9–10.3)
Chloride: 102 mmol/L (ref 98–111)
Creatinine, Ser: 0.7 mg/dL (ref 0.44–1.00)
GFR, Estimated: >60 mL/min (ref >60)
Glucose, Bld: 103 mg/dL — ABNORMAL HIGH (ref 70–99)
Potassium: 3.7 mmol/L (ref 3.5–5.1)
Sodium: 136 mmol/L (ref 135–145)
Total Bilirubin: 0.2 mg/dL (ref 0.0–1.2)
Total Protein: 7.5 g/dL (ref 6.5–8.1)

## 2024-01-31 LAB — TROPONIN I (HIGH SENSITIVITY)
Troponin I (High Sensitivity): 8 ng/L (ref <18)
Troponin I (High Sensitivity): 6 ng/L (ref <18)

## 2024-01-31 LAB — MAGNESIUM: Magnesium: 2.4 mg/dL (ref 1.7–2.4)

## 2024-01-31 LAB — TSH: TSH: 3.373 u[IU]/mL (ref 0.350–4.500)

## 2024-01-31 MED ORDER — DILTIAZEM HCL 30 MG PO TABS: 30.0000 mg | ORAL_TABLET | ORAL | Status: DC | PRN

## 2024-01-31 MED ORDER — DILTIAZEM HCL 30 MG PO TABS: 30.0000 mg | ORAL_TABLET | Freq: Four times a day (QID) | ORAL | 0 refills | Status: DC | PRN

## 2024-01-31 MED ORDER — SODIUM CHLORIDE 0.9% FLUSH
3.0000 mL | Freq: Two times a day (BID) | INTRAVENOUS | Status: DC
  Administered 2024-01-31: 3 mL via INTRAVENOUS

## 2024-01-31 MED ORDER — METOPROLOL TARTRATE 5 MG/5ML IV SOLN
5.0000 mg | Freq: Once | INTRAVENOUS | Status: AC
  Administered 2024-01-31: 5 mg via INTRAVENOUS
  Filled 2024-01-31: qty 5

## 2024-01-31 MED ORDER — DILTIAZEM HCL-DEXTROSE 125-5 MG/125ML-% IV SOLN (PREMIX)
5.0000 mg/h | INTRAVENOUS | Status: DC
  Administered 2024-01-31: 5 mg/h via INTRAVENOUS
  Filled 2024-01-31: qty 125

## 2024-01-31 MED ORDER — APIXABAN 5 MG PO TABS
5.0000 mg | ORAL_TABLET | Freq: Two times a day (BID) | ORAL | Status: DC
Start: 2024-01-31 — End: 2024-02-01
  Administered 2024-01-31: 5 mg via ORAL
  Filled 2024-01-31: qty 1

## 2024-01-31 MED ORDER — EZETIMIBE 10 MG PO TABS
10.0000 mg | ORAL_TABLET | Freq: Every day | ORAL | Status: DC
  Administered 2024-01-31: 10 mg via ORAL
  Filled 2024-01-31: qty 1

## 2024-01-31 MED ORDER — DILTIAZEM HCL ER COATED BEADS 120 MG PO CP24
120.0000 mg | ORAL_CAPSULE | Freq: Every day | ORAL | Status: DC
  Administered 2024-01-31: 120 mg via ORAL
  Filled 2024-01-31: qty 1

## 2024-01-31 MED ORDER — DILTIAZEM HCL ER COATED BEADS 120 MG PO CP24: 120.0000 mg | ORAL_CAPSULE | Freq: Every day | ORAL | 0 refills | Status: DC

## 2024-01-31 NOTE — Consult Note (Signed)
 Cardiology Consultation   Patient ID: Virginia Fox MRN: [id number]; DOB: 1951/10/13  Admit date: 01/30/2024 Date of Consult: 01/31/2024  PCP:  Mliss Sax, MD   [facility] Providers Cardiologist:  Elder Negus, MD        Patient Profile:   Virginia Fox is a 73 y.o. female with a hx of palpitations, hyperlipidemia, ischemic cholitis who is being seen 01/31/2024 for the evaluation of atrial fibrillation with RVR at the request of Dr. Katrinka Blazing.  History of Present Illness:   Virginia Fox was recently seen in clinic by Dr. Rosemary Holms for evaluation of palpitations and irregular heartbeat. She had previously presented to the ED in November 2024 with palpitations. At that time, symptoms occurred sporadically with episodes lasting up to 2 hours without clear provocating factor. She was not noted to have specific arrhythmia in the ED at that time (symptoms resolved before she was seen). Of note, patient also with an admission 2/2 ischemic colitis in 2022. Patient had outpatient echo, calcium scoring CT (hx elevated cholesterol), and had Zio placed. On 2/16, this monitor recorded first documented occurrence of atrial flutter and patient started on Eliquis 5mg  BID. On the evening of 2/23, patient reports sudden onset rapid HR >120bpm. Symptoms persisted and she appropriately presented to the [facility] ED where ECG showed afib with RVR. Patient was started on a diltiazem infusion and also received a lopressor bolus. Plans made to transfer to [facility] for admission.   On exam today, patient is back in NSR (conversion appears to have happened in transit from [facility] to [facility]). She is currently asymptomatic. Describes very clear awareness of rapid HR last night and general feeling of malaise with this rapid rate. Discussed risk factors for afib including OSA and patient denies known history. She has never been advised of loud snoring or apneic episodes. She reports infrequent consumption  of alcohol but does note drinking a small amount last night before onset of her symptoms.    Past Medical History:  Diagnosis Date   Allergy    Anal fissure    Atherosclerosis of aorta (HCC)    seem on CT scan   Cataract    bilateral cataracts removed   GERD (gastroesophageal reflux disease)    Heart murmur    Hiatal hernia    Internal hemorrhoids    Osteoporosis     Past Surgical History:  Procedure Laterality Date   AUGMENTATION MAMMAPLASTY Bilateral    BASAL CELL CARCINOMA EXCISION     BREAST ENHANCEMENT SURGERY     CESAREAN SECTION     x 2   COLONOSCOPY     WISDOM TOOTH EXTRACTION       Home Medications:  Prior to Admission medications   Medication Sig Start Date End Date Taking? Authorizing Provider  apixaban (ELIQUIS) 5 MG TABS tablet Take 1 tablet (5 mg total) by mouth 2 (two) times daily. 01/24/24  Yes Jake Bathe, MD  Azelaic Acid 15 % gel SMARTSIG:1 sparingly Topical Daily 03/11/22  Yes [provider]  ezetimibe (ZETIA) 10 MG tablet Take 1 tablet (10 mg total) by mouth daily. 01/17/24  Yes Patwardhan, Manish J, MD  Loratadine 10 MG CAPS Take 10 mg by mouth daily as needed (allergies). 12/07/16  Yes [provider]  Multiple Vitamins-Minerals (MULTIVITAMIN WITH MINERALS) tablet Take 1 tablet by mouth daily.   Yes [provider]  omeprazole (PRILOSEC) 20 MG capsule Take 20 mg by mouth daily.   Yes [provider]  Polyethyl Glyc-Propyl Glyc PF (SYSTANE ULTRA PF) 0.4-0.3 % SOLN Place 1 drop into both eyes as needed.   Yes [provider]    Inpatient Medications: Scheduled Meds:  apixaban  5 mg Oral BID   ezetimibe  10 mg Oral Daily   sodium chloride flush  3 mL Intravenous Q12H   Continuous Infusions:  diltiazem (CARDIZEM) infusion 5 mg/hr (01/31/24 0244)   PRN Meds:   Allergies:    Allergies  Allergen Reactions   Atorvastatin Other (See Comments)    Myalgias.     Social History:   Social History    Socioeconomic History   Marital status: Widowed    Spouse name: Not on file   Number of children: Not on file   Years of education: Not on file   Highest education level: Not on file  Occupational History   Occupation: Retired  Tobacco Use   Smoking status: Never    Passive exposure: Never   Smokeless tobacco: Never  Vaping Use   Vaping status: Never Used  Substance and Sexual Activity   Alcohol use: Not Currently    Comment: socially   Drug use: No   Sexual activity: Not Currently    Birth control/protection: Post-menopausal  Other Topics Concern   Not on file  Social History Narrative   Not on file   Social Drivers of Health   Financial Resource Strain: Low Risk  (03/22/2023)   Overall Financial Resource Strain (CARDIA)    Difficulty of Paying Living Expenses: Not hard at all  Food Insecurity: No Food Insecurity (03/22/2023)   Hunger Vital Sign    Worried About Running Out of Food in the Last Year: Never true    Ran Out of Food in the Last Year: Never true  Transportation Needs: No Transportation Needs (03/22/2023)   PRAPARE - Administrator, Civil Service (Medical): No    Lack of Transportation (Non-Medical): No  Physical Activity: Sufficiently Active (03/22/2023)   Exercise Vital Sign    Days of Exercise per Week: 5 days    Minutes of Exercise per Session: 40 min  Stress: No Stress Concern Present (03/22/2023)   [employer] of Occupational Health - Occupational Stress Questionnaire    Feeling of Stress : Not at all  Social Connections: Unknown (03/22/2023)   Social Connection and Isolation Panel [NHANES]    Frequency of Communication with Friends and Family: More than three times a week    Frequency of Social Gatherings with Friends and Family: More than three times a week    Attends Religious Services: Not on file    Active Member of Clubs or Organizations: Yes    Attends Banker Meetings: 1 to 4 times per year    Marital Status:  Widowed  Recent Concern: Social Connections - Moderately Isolated (03/22/2023)   Social Connection and Isolation Panel [NHANES]    Frequency of Communication with Friends and Family: More than three times a week    Frequency of Social Gatherings with Friends and Family: More than three times a week    Attends Religious Services: Never    Database administrator or Organizations: Yes    Attends Banker Meetings: 1 to 4 times per year    Marital Status: Widowed  Intimate Partner Violence: Not At Risk (04/22/2022)   Humiliation, Afraid, Rape, and Kick questionnaire    Fear of Current or Ex-Partner: No    Emotionally Abused: No  Physically Abused: No    Sexually Abused: No    Family History:    Family History  Problem Relation Age of Onset   Multiple sclerosis Mother    Diabetes Father    Hypertension Father    Esophageal cancer Neg Hx    Pancreatic cancer Neg Hx    Stomach cancer Neg Hx    Liver disease Neg Hx    Rectal cancer Neg Hx    BRCA 1/2 Neg Hx    Colon cancer Neg Hx    Breast cancer Neg Hx      ROS:  Please see the history of present illness.   All other ROS reviewed and negative.     Physical Exam/Data:   Vitals:   01/31/24 0800 01/31/24 0815 01/31/24 0830 01/31/24 0949  BP: 118/78 (!) 114/58 (!) 111/54 (!) 121/55  Pulse: 87 64 62 78  Resp: 20 12 14    Temp:    98 F (36.7 C)  TempSrc:    Oral  SpO2: 98% 100% 99% 99%  Weight:      Height:        Intake/Output Summary (Last 24 hours) at 01/31/2024 1652 Last data filed at 01/31/2024 1300 Gross per 24 hour  Intake 1240 ml  Output --  Net 1240 ml      01/30/2024   10:34 PM 01/03/2024    1:49 PM 11/18/2023    2:47 PM  Last 3 Weights  Weight (lbs) 130 lb 132 lb 9.6 oz 133 lb 3.2 oz  Weight (kg) 58.968 kg 60.147 kg 60.419 kg     Body mass index is 21.63 kg/m.  General:  Well nourished, well developed, in no acute distress HEENT: normal Neck: no JVD Vascular: No carotid bruits; Distal  pulses 2+ bilaterally Cardiac:  normal S1, S2; RRR; no murmur  Lungs:  clear to auscultation bilaterally, no wheezing, rhonchi or rales  Abd: soft, nontender, no hepatomegaly  Ext: no edema Musculoskeletal:  No deformities, BUE and BLE strength normal and equal Skin: warm and dry  Neuro:  CNs 2-12 intact, no focal abnormalities noted Psych:  Normal affect   EKG:  ED ECG reviewed. Atrial fibrillation with RVR, ventricular rate 125bpm.  Telemetry:  Telemetry was personally reviewed and demonstrates:  patient with RVR in the 120s at [facility]. Telemetry at [facility] shows only sinus rhythm.   Relevant CV Studies:  Cardiac Studies & Procedures   ______________________________________________________________________________________________     ECHOCARDIOGRAM  ECHOCARDIOGRAM COMPLETE 01/20/2024  Narrative ECHOCARDIOGRAM REPORT    Patient Name:   SAMEERAH NACHTIGAL Date of Exam: 01/20/2024 Medical Rec #:  [id number]          Height:       64.0 in Accession #:    [id number]         Weight:       132.6 lb Date of Birth:  Oct 15, 1951          BSA:          1.643 m Patient Age:    72 years           BP:           140/70 mmHg Patient Gender: F                  HR:           69 bpm. Exam Location:  [address]  Procedure: 2D Echo, 3D Echo and Strain Analysis (Both Spectral and Color Flow Doppler were  utilized during procedure).  Indications:    Irregular heartbeat; R00.2 Palpitations  History:        Patient has no prior history of Echocardiogram examinations. Risk Factors:Dyslipidemia. Atherosclerosis of aorta.  Sonographer:    Cathie Beams RCS Referring Phys: [id number] [facility] J PATWARDHAN  IMPRESSIONS   1. Left ventricular ejection fraction, by estimation, is 60 to 65%. Left ventricular ejection fraction by 3D volume is 61 %. The left ventricle has normal function. The left ventricle has no regional wall motion abnormalities. Left ventricular diastolic parameters are consistent with Grade I  diastolic dysfunction (impaired relaxation). The average left ventricular global longitudinal strain is -19.3 %. The global longitudinal strain is normal. 2. Right ventricular systolic function is normal. The right ventricular size is normal. 3. Left atrial size was mildly dilated. 4. The mitral valve is normal in structure. Trivial mitral valve regurgitation. No evidence of mitral stenosis. 5. The aortic valve is tricuspid. Aortic valve regurgitation is not visualized. Aortic valve sclerosis/calcification is present, without any evidence of aortic stenosis. 6. The inferior vena cava is normal in size with greater than 50% respiratory variability, suggesting right atrial pressure of 3 mmHg.  FINDINGS Left Ventricle: Left ventricular ejection fraction, by estimation, is 60 to 65%. Left ventricular ejection fraction by 3D volume is 61 %. The left ventricle has normal function. The left ventricle has no regional wall motion abnormalities. The average left ventricular global longitudinal strain is -19.3 %. Strain was performed and the global longitudinal strain is normal. The left ventricular internal cavity size was normal in size. There is no left ventricular hypertrophy. Left ventricular diastolic parameters are consistent with Grade I diastolic dysfunction (impaired relaxation).  Right Ventricle: The right ventricular size is normal. No increase in right ventricular wall thickness. Right ventricular systolic function is normal.  Left Atrium: Left atrial size was mildly dilated.  Right Atrium: Right atrial size was normal in size.  Pericardium: There is no evidence of pericardial effusion.  Mitral Valve: The mitral valve is normal in structure. Trivial mitral valve regurgitation. No evidence of mitral valve stenosis.  Tricuspid Valve: The tricuspid valve is normal in structure. Tricuspid valve regurgitation is trivial. No evidence of tricuspid stenosis.  Aortic Valve: The aortic valve is  tricuspid. Aortic valve regurgitation is not visualized. Aortic valve sclerosis/calcification is present, without any evidence of aortic stenosis.  Pulmonic Valve: The pulmonic valve was normal in structure. Pulmonic valve regurgitation is not visualized. No evidence of pulmonic stenosis.  Aorta: The aortic root is normal in size and structure.  Venous: The inferior vena cava is normal in size with greater than 50% respiratory variability, suggesting right atrial pressure of 3 mmHg.  IAS/Shunts: No atrial level shunt detected by color flow Doppler.  Additional Comments: 3D was performed not requiring image post processing on an independent workstation and was normal.   LEFT VENTRICLE PLAX 2D LVIDd:         4.30 cm         Diastology LVIDs:         2.70 cm         LV e' medial:    7.62 cm/s LV PW:         0.80 cm         LV E/e' medial:  15.0 LV IVS:        0.80 cm         LV e' lateral:   12.60 cm/s LVOT diam:     1.80 cm  LV E/e' lateral: 9.0 LV SV:         57 LV SV Index:   35              2D LVOT Area:     2.54 cm        Longitudinal Strain 2D Strain GLS  -19.3 % Avg:  3D Volume EF LV 3D EF:    Left ventricul ar ejection fraction by 3D volume is 61 %.  3D Volume EF: 3D EF:        61 % LV EDV:       100 ml LV ESV:       39 ml LV SV:        61 ml  RIGHT VENTRICLE RV Basal diam:  2.90 cm RV S prime:     17.50 cm/s TAPSE (M-mode): 2.4 cm  LEFT ATRIUM             Index        RIGHT ATRIUM           Index LA diam:        2.80 cm 1.70 cm/m   RA Area:     12.80 cm LA Vol (A2C):   35.0 ml 21.30 ml/m  RA Volume:   31.90 ml  19.42 ml/m LA Vol (A4C):   26.3 ml 16.01 ml/m LA Biplane Vol: 31.1 ml 18.93 ml/m AORTIC VALVE LVOT Vmax:   84.40 cm/s LVOT Vmean:  56.100 cm/s LVOT VTI:    0.224 m  AORTA Ao Root diam: 2.70 cm Ao Asc diam:  3.10 cm  MITRAL VALVE MV Area (PHT): 5.34 cm     SHUNTS MV Decel Time: 142 msec     Systemic VTI:  0.22 m MV E  velocity: 114.00 cm/s  Systemic Diam: 1.80 cm MV A velocity: 120.00 cm/s MV E/A ratio:  0.95  Arvilla Meres MD Electronically signed by Arvilla Meres MD Signature Date/Time: 01/20/2024/3:36:32 PM    Final      CT SCANS  CT CARDIAC SCORING (SELF PAY ONLY) 01/17/2024  Addendum 01/22/2024  8:09 PM ADDENDUM REPORT: 01/22/2024 20:06  EXAM: OVER-READ INTERPRETATION CT CHEST  The following report is an over-read performed by radiologist Dr. Romona Curls of [facility] on 01/22/2024. This over-read does not include interpretation of cardiac or coronary anatomy or pathology. The coronary calcium score interpretation by the cardiologist is attached.  COMPARISON:  CT chest dated 05/06/2022  FINDINGS: Cardiovascular: Vascular calcifications are seen in the thoracic aorta. Normal heart size. No pericardial effusion.  Mediastinum/Nodes: No enlarged mediastinal lymph nodes. The visible trachea and esophagus demonstrate no significant findings.  Lungs/Pleura: Mild bibasilar atelectasis.  Upper Abdomen: No acute abnormality.  Musculoskeletal: Degenerative changes are seen in the spine.  IMPRESSION: 1. No acute findings in the chest.  Aortic Atherosclerosis (ICD10-I70.0).   Electronically Signed By: Romona Curls M.D. On: 01/22/2024 20:06  Narrative CLINICAL DATA:  Cardiovascular Disease Risk stratification  EXAM: Coronary Calcium Score  TECHNIQUE: A gated, non-contrast computed tomography scan of the heart was performed using 3 mm slice thickness. Axial images were analyzed on a dedicated workstation. Calcium scoring of the coronary arteries was performed using the Agatston method.  FINDINGS: Coronary arteries: Normal origins.  Coronary Calcium Score:  Left main: 0  Left anterior descending artery: 0  Left circumflex artery: 0  Right coronary artery: 3.47  Total: 3.47  Percentile: 31st  Pericardium: Normal.  Aorta: Normal caliber.   Aortic atherosclerosis.  Non-cardiac:  See separate report from [facility].  IMPRESSION: 1. Coronary calcium score of 3.47. This was 31st percentile for age-, race-, and sex-matched controls (MESA). 2. Aortic atherosclerosis.  RECOMMENDATIONS: Coronary artery calcium (CAC) score is a strong predictor of incident coronary heart disease (CHD) and provides predictive information beyond traditional risk factors. CAC scoring is reasonable to use in the decision to withhold, postpone, or initiate statin therapy in intermediate-risk or selected borderline-risk asymptomatic adults (age 47-75 years and LDL-C >=70 to <190 mg/dL) who do not have diabetes or established atherosclerotic cardiovascular disease (ASCVD).* In intermediate-risk (10-year ASCVD risk >=7.5% to <20%) adults or selected borderline-risk (10-year ASCVD risk >=5% to <7.5%) adults in whom a CAC score is measured for the purpose of making a treatment decision the following recommendations have been made:  If CAC=0, it is reasonable to withhold statin therapy and reassess in 5 to 10 years, as long as higher risk conditions are absent (diabetes mellitus, family history of premature CHD in first degree relatives (males <55 years; females <65 years), cigarette smoking, or LDL >=190 mg/dL).  If CAC is 1 to 99, it is reasonable to initiate statin therapy for patients >=47 years of age.  If CAC is >=100 or >=75th percentile, it is reasonable to initiate statin therapy at any age.  Cardiology referral should be considered for patients with CAC scores >=400 or >=75th percentile.  *2018 AHA/ACC/AACVPR/AAPA/ABC/ACPM/ADA/AGS/APhA/ASPC/NLA/PCNA Guideline on the Management of Blood Cholesterol: A Report of the American College of Cardiology/American Heart Association Task Force on Clinical Practice Guidelines. J Am Coll Cardiol. 2019;73(24):3168-3209.  Zoila Shutter, MD  Electronically Signed: By: Chrystie Nose  M.D. On: 01/17/2024 15:15     ______________________________________________________________________________________________       Laboratory Data:  High Sensitivity Troponin:   Recent Labs  Lab 01/31/24 1121 01/31/24 1320  TROPONINIHS 8 6     Chemistry Recent Labs  Lab 01/30/24 2331 01/31/24 1121  NA 136 138  K 3.7 4.1  CL 102 109  CO2 22 22  GLUCOSE 103* 87  BUN 18 12  CREATININE 0.70 0.66  CALCIUM 9.5 8.9  MG 2.4  --   GFRNONAA >60 >60  ANIONGAP 12 7    Recent Labs  Lab 01/30/24 2331  PROT 7.5  ALBUMIN 4.5  AST 26  ALT 19  ALKPHOS 73  BILITOT 0.2   Lipids No results for input(s): "CHOL", "TRIG", "HDL", "LABVLDL", "LDLCALC", "CHOLHDL" in the last 168 hours.  Hematology Recent Labs  Lab 01/30/24 2331 01/31/24 1121  WBC 11.3* 9.3  RBC 4.32 3.81*  HGB 12.8 11.4*  HCT 40.3 35.7*  MCV 93.3 93.7  MCH 29.6 29.9  MCHC 31.8 31.9  RDW 12.8 13.1  PLT 273 218   Thyroid  Recent Labs  Lab 01/30/24 2331  TSH 3.373    BNPNo results for input(s): "BNP", "PROBNP" in the last 168 hours.  DDimer No results for input(s): "DDIMER" in the last 168 hours.   Radiology/Studies:  DG Chest 2 View Result Date: 01/31/2024 CLINICAL DATA:  Atrial fibrillation EXAM: CHEST - 2 VIEW COMPARISON:  10/26/2023 FINDINGS: The lungs are hyperinflated with diffuse interstitial prominence. No focal airspace consolidation or pulmonary edema. No pleural effusion or pneumothorax. Normal cardiomediastinal contours. IMPRESSION: 1. No active cardiopulmonary disease. 2. Findings of COPD. Electronically Signed   By: Deatra Robinson M.D.   On: 01/31/2024 03:21     Assessment and Plan:   Paroxysmal atrial fibrillation with RVR  Patient newly diagnosed with atrial fibrillation on outpatient monitor  presented to [facility] ED with rapid and irregular HR on 2/23. ECG with afib/RVR. Patient given Lopressor IVP and started on Diltiazem infusion. Patient transferred to [facility] for admission, is now in NSR.  She appears to have cardioverted en route.  She was previously started on Eliquis upon monitor diagnosis of afib and has continued taking 5mg  BID. Discussed CHA2DS2-VASc Score = 2 and stroke risk. Given prior episode of ischemic colitis (which very well could have been embolic 2/2 afib), reasonable to continue with [facility] as patient low risk for bleeding complications. Recommend daily Cardizem CD 120mg  for rate control with 30mg  PRN short acting for breakthrough RVR. Discussed possible triggers for afib including alcohol and caffeine Will arrange afib clinic follow up.  Encouraged patient to purchase device such as Apple Watch or Kardia mobile to monitor rhythm/rate.  Hyperlipidemia Continue Zetia. Recent cardiac calcium scoring CT with minimal calcium score 3.47.   Risk Assessment/Risk Scores:          CHA2DS2-VASc Score = 2   This indicates a 2.2% annual risk of stroke. The patient's score is based upon: CHF History: 0 HTN History: 0 Diabetes History: 0 Stroke History: 0 Vascular Disease History: 0 Age Score: 1 Gender Score: 1      [facility] will sign off.   Medication Recommendations:  Cardizem CD 120mg  with PRN Diltiazem 30mg  for breakthrough tachycardia. Other recommendations (labs, testing, etc):   Follow up as an outpatient:  afib clinic  For questions or updates, please contact [facility] Please consult [email or web address] for contact info under    Signed, Perlie Gold, PA-C  01/31/2024 4:52 PM

## 2024-01-31 NOTE — Care Management Obs Status (Signed)
 MEDICARE OBSERVATION STATUS NOTIFICATION   Patient Details  Name: Virginia Fox MRN: [id number] Date of Birth: 01-27-1951   Medicare Observation Status Notification Given:  Yes  Will be sent Certified Mail 02/01/24 by Case Management office  Michel Bickers, RN 01/31/2024, 6:46 PM

## 2024-01-31 NOTE — ED Notes (Signed)
 Pt request to call daughter to update her. This nurse called daughter to update about transfer to [facility].

## 2024-01-31 NOTE — Discharge Instructions (Signed)

## 2024-01-31 NOTE — Progress Notes (Signed)
 DISCHARGE NOTE HOME Virginia Fox to be discharged Home per MD order. Discussed prescriptions and follow up appointments with the patient. Prescriptions given to patient; medication list explained in detail. Patient verbalized understanding.  Skin clean, dry and intact without evidence of skin break down, no evidence of skin tears noted. IV catheter discontinued intact. Site without signs and symptoms of complications. Dressing and pressure applied. Pt denies pain at the site currently. No complaints noted.  Patient free of lines, drains, and wounds.   An After Visit Summary (AVS) was printed and given to the patient. Patient escorted via wheelchair, and discharged home via private auto.  Velia Meyer, RN

## 2024-01-31 NOTE — TOC CM/SW Note (Signed)
 Transition of Care [facility]) - Inpatient Brief Assessment   Patient Details  Name: Virginia Fox MRN: [id number] Date of Birth: [birth year]  [facility] [address] CM/SW Contact:    Leone Haven, RN Phone Number: 01/31/2024, 4:54 PM   Clinical Narrative: From home alone, has PCP  Dr. Doreene Burke and insurance on file, states has no HH services in place at this time or DME at home.  States daughter  will transport them home at [employer] and family is support system, states gets medications from [facility] in [place] on [address].  Pta self ambulatory.   Transition of Care Asessment: Insurance and Status: Insurance coverage has been reviewed Patient has primary care physician: Yes Home environment has been reviewed: alone in house Prior level of function:: indep Prior/Current Home Services: No current home services Social Drivers of Health Review: SDOH reviewed no interventions necessary Readmission risk has been reviewed: Yes Transition of care needs: no transition of care needs at this time

## 2024-01-31 NOTE — ED Notes (Signed)
 Patient brushed teeth Washed face

## 2024-01-31 NOTE — ED Notes (Signed)
 Cardiology consult paged for Dr. Eudelia Bunch.

## 2024-01-31 NOTE — H&P (Addendum)
 History and Physical    Patient: Virginia Fox:[id number] DOB: 1951/11/20 DOA: 01/30/2024 DOS: the patient was seen and examined on 01/31/2024 PCP: Mliss Sax, MD  Patient coming from: [facility]  Chief Complaint:  Chief Complaint  Patient presents with   Tachycardia   HPI: Virginia Fox is a 73 y.o. female with medical history significant of presents with a rapid heartbeat. She is accompanied by her daughter.  She experienced a rapid heartbeat that began around 6:30 PM the previous evening, prompting her to go to the [facility] around 10 PM. No pain was associated with the rapid heartbeat, but she felt her heart racing, with a pulse of 120-123 beats per minute. She also noted that her blood pressure appeared 'super high'.  Denies any complaints of chest pain, shortness of breath, or significant leg swelling.  Patient had a Holter monitor on 2/13 which noted episode of a flutter.  She was notified of the results on 2/17 and started on Eliquis which she has been taking regularly as prescribed.  On admission in the emergency department patient was noted to be afebrile with heart rates elevated up to 115 in atrial fibrillation, and all other vital signs maintained.  Labs noted WBC 11.3.  Chest x-ray noted no acute cardiopulmonary disease and findings suggestive of COPD.  Patient had been given metoprolol IV and 1 L normal saline IV fluids without improvement in symptoms.  Case have been discussed with cardiology who recommended starting diltiazem drip.  Review of Systems: As mentioned in the history of present illness. All other systems reviewed and are negative. Past Medical History:  Diagnosis Date   Allergy    Anal fissure    Atherosclerosis of aorta (HCC)    seem on CT scan   Cataract    bilateral cataracts removed   GERD (gastroesophageal reflux disease)    Heart murmur    Hiatal hernia    Internal hemorrhoids    Osteoporosis    Past Surgical  History:  Procedure Laterality Date   AUGMENTATION MAMMAPLASTY Bilateral    BASAL CELL CARCINOMA EXCISION     BREAST ENHANCEMENT SURGERY     CESAREAN SECTION     x 2   COLONOSCOPY     WISDOM TOOTH EXTRACTION     Social History:  reports that she has never smoked. She has never been exposed to tobacco smoke. She has never used smokeless tobacco. She reports that she does not currently use alcohol. She reports that she does not use drugs.  Allergies  Allergen Reactions   Atorvastatin Other (See Comments)    Myalgias.     Family History  Problem Relation Age of Onset   Multiple sclerosis Mother    Diabetes Father    Hypertension Father    Esophageal cancer Neg Hx    Pancreatic cancer Neg Hx    Stomach cancer Neg Hx    Liver disease Neg Hx    Rectal cancer Neg Hx    BRCA 1/2 Neg Hx    Colon cancer Neg Hx    Breast cancer Neg Hx     Prior to Admission medications   Medication Sig Start Date End Date Taking? Authorizing Provider  apixaban (ELIQUIS) 5 MG TABS tablet Take 1 tablet (5 mg total) by mouth 2 (two) times daily. 01/24/24   Jake Bathe, MD  Azelaic Acid 15 % gel SMARTSIG:1 sparingly Topical Daily 03/11/22   [provider]  ezetimibe (ZETIA) 10 MG tablet  Take 1 tablet (10 mg total) by mouth daily. 01/17/24   Elder Negus, MD  Loratadine 10 MG CAPS  12/07/16   [provider]  Multiple Vitamins-Minerals (MULTIVITAMIN WITH MINERALS) tablet Take 1 tablet by mouth daily.    [provider]  omeprazole (PRILOSEC) 20 MG capsule Take 20 mg by mouth daily.    [provider]  Polyethyl Glyc-Propyl Glyc PF (SYSTANE ULTRA PF) 0.4-0.3 % SOLN     [provider]    Physical Exam: Vitals:   01/31/24 0800 01/31/24 0815 01/31/24 0830 01/31/24 0949  BP: 118/78 (!) 114/58 (!) 111/54 (!) 121/55  Pulse: 87 64 62 78  Resp: 20 12 14    Temp:    98 F (36.7 C)  TempSrc:    Oral  SpO2: 98% 100% 99% 99%  Weight:      Height:         Constitutional: Early female who appears to be in no acute distress at this time. Eyes: PERRL, lids and conjunctivae normal ENMT: Mucous membranes are moist.  .Normal dentition.  Neck: normal, supple, no masses, no JVD appreciated Respiratory: clear to auscultation bilaterally, no wheezing, no crackles. Normal respiratory effort. No accessory muscle use.  Cardiovascular: Regular with intermittent extra beats.  No lower extremity swelling 2+ pedal pulses. No carotid bruits.  Abdomen: no tenderness, no masses palpated.  Bowel sounds positive.  Musculoskeletal: no clubbing / cyanosis. No joint deformity upper and lower extremities. Good ROM, no contractures. Normal muscle tone.  Skin: no rashes, lesions, ulcers. No induration Neurologic: CN 2-12 grossly intact. Sensation intact, DTR normal. Strength 5/5 in all 4.  Psychiatric: Normal judgment and insight. Alert and oriented x 3. Normal mood.   Data Reviewed:  EKG revealed atrial fibrillation with RVR and 125 bpm.  Reviewed labs, imaging, and pertinent records as documented.  Assessment and Plan:  Paroxysmal atrial fibrillation on chronic anticoagulation Patient presents in atrial fibrillation with RVR with heart rates noted to be into the 120s.  Seen on recent Holter monitor from 2/13 noted to have new onset atrial flutter.  TSH was noted to be within normal limits at 3.373  on 2/23 and  echocardiogram from 2/13 noted EF to be 60 to 65% with grade 1 diastolic dysfunction.  Patient was initially given metoprolol IV without improvement in heart rate and was thereafter placed on Cardizem drip with improvement in heart rate .CHA2DS2-VASc score equal to 2. -Admit to a cardiac telemetry bed -Continue Cardizem drip -Goal potassium at least 4 and magnesium at least 2 -Continue Eliquis -Cardiology consulted, will follow-up for any further recommendations  Leukocytosis Acute.  WBC elevated at 11.7. -Check CBC with differential in a.m  Mixed  hyperlipidemia Lipid panel from 01/03/2024 noted total cholesterol 219, LDL 131, triglycerides 56.  Patient unable to tolerate atorvastatin due to myalgias. -Continue Zetia  GERD -Continue omeprazole in outpatient setting.  Patient declined need to resume in inpatient setting   DVT prophylaxis: Eliquis  Advance Care Planning:   Code Status: Full Code   Consults: Cardiology  Family Communication: Daughter updated at bedside  Severity of Illness: The appropriate patient status for this patient is INPATIENT. Inpatient status is judged to be reasonable and necessary in order to provide the required intensity of service to ensure the patient's safety. The patient's presenting symptoms, physical exam findings, and initial radiographic and laboratory data in the context of their chronic comorbidities is felt to place them at high risk for further clinical deterioration. Furthermore,  it is not anticipated that the patient will be medically stable for discharge from the hospital within 2 midnights of admission.   * I certify that at the point of admission it is my clinical judgment that the patient will require inpatient hospital care spanning beyond 2 midnights from the point of admission due to high intensity of service, high risk for further deterioration and high frequency of surveillance required.*  Author: Clydie Braun, MD 01/31/2024 10:30 AM  For on call review [email or web address].

## 2024-01-31 NOTE — Care Management CC44 (Signed)
 Condition Code 44 Documentation Completed  Patient Details  Name: Virginia Fox MRN: [id number] Date of Birth: 08/21/1951   Condition Code 44 given:  Yes Patient signature on Condition Code 44 notice:  Yes   Documentation of 2 MD's agreement:  Yes Code 44 added to claim:  Yes  I, Michel Bickers, RN, verbally reviewed observation notice with   [name]  telephonically at  01/31/24 6:40.   Consent for verbal signature provided to Michel Bickers, RN.  01/31/2024, 6:41 PM. Patient was on her way home when the Code 44 was called. Will send copy certified mail  02/01/24       Michel Bickers, RN 01/31/2024, 6:46 PM

## 2024-01-31 NOTE — ED Notes (Signed)
 Peanut butter crackers [place]

## 2024-01-31 NOTE — Discharge Summary (Addendum)
 Physician Discharge Summary   Patient: Virginia Fox MRN: [id number] DOB: [birth year]  Admit date:     01/30/2024  Discharge date: 01/31/24  Discharge Physician: Clydie Braun   PCP: Mliss Sax, MD   Recommendations at discharge:   Follow up with A-fib clinic in the outpatient setting  Discharge Diagnoses: Principal Problem:   Atrial fibrillation with RVR (HCC) Active Problems:   Normocytic anemia   Mixed hyperlipidemia   GERD (gastroesophageal reflux disease)  Resolved Problems:    Leukocytosis  Hospital Course: She experienced a rapid heartbeat that began around 6:30 PM the previous evening, prompting [facility]. No pain was associated with the rapid heartbeat, but she felt her heart racing, with a pulse of 120-123 beats per minute. She also noted that her blood pressure appeared 'super high'.  Denies any complaints of chest pain, shortness of breath, or significant leg swelling.   Patient had a Holter monitor on 2/13 which noted episode of a flutter.  She was notified of the results on 2/17 and started on Eliquis which she has been taking regularly as prescribed.   On admission in the emergency department patient was noted to be afebrile with heart rates elevated up to 115 in atrial fibrillation, and all other vital signs maintained.  Labs noted WBC 11.3.  Chest x-ray noted no acute cardiopulmonary disease and findings suggestive of COPD.  Patient had been given metoprolol IV and 1 L normal saline IV fluids without improvement in symptoms.  Case have been discussed with cardiology who recommended starting diltiazem drip.  Patient was noted to have gone back into a sinus likely prior to transfer to [facility].  Assessment and Plan:  Atrial fibrillation with RVR Prior to arrival patient presented with complaints of palpitations.  Found to be in A-fib with RVR with heart rates elevated into the 120s.  Patient had been given metoprolol IV  without improvement and thereafter placed on a Cardizem drip with improvement in heart rates.  Records note she had been seen to be in atrial flutter by Holter monitor done on 2/13 and started on Eliquis on 2/17.  Patient reported compliance with medication. CHA2DS2-VASc score equal to 2.  Cardizem drip was discontinued and patient was started on Cardizem CD 120 mg daily with Cardizem 30 mg p.o. as needed for elevated heart rates.  Referral to the atrial fibrillation clinic was also placed by cardiology.  Recommended to continue Eliquis.  Leukocytosis Resolved.  WBC elevated 11.7, but repeat check within normal limits at 9.3.  Suspect possibly reactive in nature.  Normocytic anemia Chronic.  Hemoglobin noted to be 12.8 down to 11.4 on repeat check, but appears to be around patient's baseline. -Continue to outpatient setting  Mixed hyperlipidemia Lipid panel from 01/03/2024 noted total cholesterol 219, LDL 131, triglycerides 56.  Patient unable to tolerate atorvastatin due to myalgias. -Continue Zetia  GERD -Continue omeprazole in outpatient setting.       Consultants: Cardiology Procedures performed: None Disposition: Home Diet recommendation:  Cardiac diet DISCHARGE MEDICATION: Allergies as of 01/31/2024       Reactions   Atorvastatin Other (See Comments)   Myalgias.         Medication List     TAKE these medications    apixaban 5 MG Tabs tablet Commonly known as: ELIQUIS Take 1 tablet (5 mg total) by mouth 2 (two) times daily.   Azelaic Acid 15 % gel SMARTSIG:1 sparingly Topical Daily  diltiazem 120 MG 24 hr capsule Commonly known as: CARDIZEM CD Take 1 capsule (120 mg total) by mouth daily.   diltiazem 30 MG tablet Commonly known as: CARDIZEM Take 1 tablet (30 mg total) by mouth every 6 (six) hours as needed (Heart rates greater than 110).   ezetimibe 10 MG tablet Commonly known as: ZETIA Take 1 tablet (10 mg total) by mouth daily.   Loratadine 10 MG  Caps Take 10 mg by mouth daily as needed (allergies).   multivitamin with minerals tablet Take 1 tablet by mouth daily.   omeprazole 20 MG capsule Commonly known as: PRILOSEC Take 20 mg by mouth daily.   Systane Ultra PF 0.4-0.3 % Soln Generic drug: Polyethyl Glyc-Propyl Glyc PF Place 1 drop into both eyes as needed.        Discharge Exam: Filed Weights   01/30/24 2234  Weight: 59 kg   Constitutional: Female currently in no acute distress. Eyes: PERRL, lids and conjunctivae normal ENMT: Mucous membranes are moist. Posterior pharynx clear of any exudate or lesions.Normal dentition.  Neck: normal, supple,  Respiratory: clear to auscultation bilaterally, no wheezing, no crackles. Normal respiratory effort. No accessory muscle use.  Cardiovascular: Regular rate. No extremity edema. 2+ pedal pulses. No carotid bruits. Musculoskeletal: no clubbing / cyanosis. No joint deformity upper and lower extremities. Good ROM, no contractures. Normal muscle tone.  Skin: no rashes, lesions, ulcers. No induration Neurologic: CN 2-12 grossly intact. Strength 5/5 in all 4.  Psychiatric: Normal judgment and insight. Alert and oriented x 3. Normal mood.    Condition at discharge: good  The results of significant diagnostics from this hospitalization (including imaging, microbiology, ancillary and laboratory) are listed below for reference.   Imaging Studies: DG Chest 2 View Result Date: 01/31/2024 CLINICAL DATA:  Atrial fibrillation EXAM: CHEST - 2 VIEW COMPARISON:  10/26/2023 FINDINGS: The lungs are hyperinflated with diffuse interstitial prominence. No focal airspace consolidation or pulmonary edema. No pleural effusion or pneumothorax. Normal cardiomediastinal contours. IMPRESSION: 1. No active cardiopulmonary disease. 2. Findings of COPD. Electronically Signed   By: Deatra Robinson M.D.   On: 01/31/2024 03:21   CT CARDIAC SCORING (SELF PAY ONLY) Addendum Date: 01/22/2024 ADDENDUM REPORT:  01/22/2024 20:06 EXAM: OVER-READ INTERPRETATION CT CHEST The following report is an over-read performed by radiologist Dr. Romona Curls of [facility] on 01/22/2024. This over-read does not include interpretation of cardiac or coronary anatomy or pathology. The coronary calcium score interpretation by the cardiologist is attached. COMPARISON:  CT chest dated 05/06/2022 FINDINGS: Cardiovascular: Vascular calcifications are seen in the thoracic aorta. Normal heart size. No pericardial effusion. Mediastinum/Nodes: No enlarged mediastinal lymph nodes. The visible trachea and esophagus demonstrate no significant findings. Lungs/Pleura: Mild bibasilar atelectasis. Upper Abdomen: No acute abnormality. Musculoskeletal: Degenerative changes are seen in the spine. IMPRESSION: 1. No acute findings in the chest. Aortic Atherosclerosis (ICD10-I70.0). Electronically Signed   By: Romona Curls M.D.   On: 01/22/2024 20:06   Result Date: 01/22/2024 CLINICAL DATA:  Cardiovascular Disease Risk stratification EXAM: Coronary Calcium Score TECHNIQUE: A gated, non-contrast computed tomography scan of the heart was performed using 3 mm slice thickness. Axial images were analyzed on a dedicated workstation. Calcium scoring of the coronary arteries was performed using the Agatston method. FINDINGS: Coronary arteries: Normal origins. Coronary Calcium Score: Left main: 0 Left anterior descending artery: 0 Left circumflex artery: 0 Right coronary artery: 3.47 Total: 3.47 Percentile: 31st Pericardium: Normal. Aorta: Normal caliber.  Aortic atherosclerosis. Non-cardiac: See separate report from  [facility]. IMPRESSION: 1. Coronary calcium score of 3.47. This was 31st percentile for age-, race-, and sex-matched controls (MESA). 2. Aortic atherosclerosis. RECOMMENDATIONS: Coronary artery calcium (CAC) score is a strong predictor of incident coronary heart disease (CHD) and provides predictive information beyond traditional  risk factors. CAC scoring is reasonable to use in the decision to withhold, postpone, or initiate statin therapy in intermediate-risk or selected borderline-risk asymptomatic adults (age 29-75 years and LDL-C >=70 to <190 mg/dL) who do not have diabetes or established atherosclerotic cardiovascular disease (ASCVD).* In intermediate-risk (10-year ASCVD risk >=7.5% to <20%) adults or selected borderline-risk (10-year ASCVD risk >=5% to <7.5%) adults in whom a CAC score is measured for the purpose of making a treatment decision the following recommendations have been made: If CAC=0, it is reasonable to withhold statin therapy and reassess in 5 to 10 years, as long as higher risk conditions are absent (diabetes mellitus, family history of premature CHD in first degree relatives (males <55 years; females <65 years), cigarette smoking, or LDL >=190 mg/dL). If CAC is 1 to 99, it is reasonable to initiate statin therapy for patients >=31 years of age. If CAC is >=100 or >=75th percentile, it is reasonable to initiate statin therapy at any age. Cardiology referral should be considered for patients with CAC scores >=400 or >=75th percentile. *2018 AHA/ACC/AACVPR/AAPA/ABC/ACPM/ADA/AGS/APhA/ASPC/NLA/PCNA Guideline on the Management of Blood Cholesterol: A Report of the American College of Cardiology/American Heart Association Task Force on Clinical Practice Guidelines. J Am Coll Cardiol. 2019;73(24):3168-3209. Zoila Shutter, MD Electronically Signed: By: Chrystie Nose M.D. On: 01/17/2024 15:15   ECHOCARDIOGRAM COMPLETE Result Date: 01/20/2024    ECHOCARDIOGRAM REPORT   Patient Name:   NORINE REDDINGTON Date of Exam: 01/20/2024 Medical Rec #:  [id number]          Height:       64.0 in Accession #:    [id number]         Weight:       132.6 lb Date of Birth:  May 03, 1951          BSA:          1.643 m Patient Age:    72 years           BP:           140/70 mmHg Patient Gender: F                  HR:           69 bpm. Exam  Location:  [address] Procedure: 2D Echo, 3D Echo and Strain Analysis (Both Spectral and Color Flow            Doppler were utilized during procedure). Indications:    Irregular heartbeat; R00.2 Palpitations  History:        Patient has no prior history of Echocardiogram examinations.                 Risk Factors:Dyslipidemia. Atherosclerosis of aorta.  Sonographer:    Cathie Beams RCS Referring Phys: [id number] [facility] PATWARDHAN IMPRESSIONS  1. Left ventricular ejection fraction, by estimation, is 60 to 65%. Left ventricular ejection fraction by 3D volume is 61 %. The left ventricle has normal function. The left ventricle has no regional wall motion abnormalities. Left ventricular diastolic  parameters are consistent with Grade I diastolic dysfunction (impaired relaxation). The average left ventricular global longitudinal strain is -19.3 %. The global longitudinal strain is normal.  2. Right ventricular systolic function  is normal. The right ventricular size is normal.  3. Left atrial size was mildly dilated.  4. The mitral valve is normal in structure. Trivial mitral valve regurgitation. No evidence of mitral stenosis.  5. The aortic valve is tricuspid. Aortic valve regurgitation is not visualized. Aortic valve sclerosis/calcification is present, without any evidence of aortic stenosis.  6. The inferior vena cava is normal in size with greater than 50% respiratory variability, suggesting right atrial pressure of 3 mmHg. FINDINGS  Left Ventricle: Left ventricular ejection fraction, by estimation, is 60 to 65%. Left ventricular ejection fraction by 3D volume is 61 %. The left ventricle has normal function. The left ventricle has no regional wall motion abnormalities. The average left ventricular global longitudinal strain is -19.3 %. Strain was performed and the global longitudinal strain is normal. The left ventricular internal cavity size was normal in size. There is no left ventricular hypertrophy. Left  ventricular diastolic parameters are consistent with Grade I diastolic dysfunction (impaired relaxation). Right Ventricle: The right ventricular size is normal. No increase in right ventricular wall thickness. Right ventricular systolic function is normal. Left Atrium: Left atrial size was mildly dilated. Right Atrium: Right atrial size was normal in size. Pericardium: There is no evidence of pericardial effusion. Mitral Valve: The mitral valve is normal in structure. Trivial mitral valve regurgitation. No evidence of mitral valve stenosis. Tricuspid Valve: The tricuspid valve is normal in structure. Tricuspid valve regurgitation is trivial. No evidence of tricuspid stenosis. Aortic Valve: The aortic valve is tricuspid. Aortic valve regurgitation is not visualized. Aortic valve sclerosis/calcification is present, without any evidence of aortic stenosis. Pulmonic Valve: The pulmonic valve was normal in structure. Pulmonic valve regurgitation is not visualized. No evidence of pulmonic stenosis. Aorta: The aortic root is normal in size and structure. Venous: The inferior vena cava is normal in size with greater than 50% respiratory variability, suggesting right atrial pressure of 3 mmHg. IAS/Shunts: No atrial level shunt detected by color flow Doppler. Additional Comments: 3D was performed not requiring image post processing on an independent workstation and was normal.  LEFT VENTRICLE PLAX 2D LVIDd:         4.30 cm         Diastology LVIDs:         2.70 cm         LV e' medial:    7.62 cm/s LV PW:         0.80 cm         LV E/e' medial:  15.0 LV IVS:        0.80 cm         LV e' lateral:   12.60 cm/s LVOT diam:     1.80 cm         LV E/e' lateral: 9.0 LV SV:         57 LV SV Index:   35              2D LVOT Area:     2.54 cm        Longitudinal                                Strain                                2D Strain GLS  -19.3 %  Avg:                                 3D Volume EF                                 LV 3D EF:    Left                                             ventricul                                             ar                                             ejection                                             fraction                                             by 3D                                             volume is                                             61 %.                                 3D Volume EF:                                3D EF:        61 %                                LV EDV:       100 ml                                LV ESV:       39 ml                                LV SV:        61 ml RIGHT VENTRICLE RV Basal diam:  2.90 cm RV S prime:     17.50 cm/s TAPSE (M-mode): 2.4 cm LEFT ATRIUM             Index        RIGHT ATRIUM           Index LA diam:        2.80 cm 1.70 cm/m   RA Area:     12.80 cm LA Vol (A2C):   35.0 ml 21.30 ml/m  RA Volume:   31.90 ml  19.42 ml/m LA Vol (A4C):   26.3 ml 16.01 ml/m LA Biplane Vol: 31.1 ml 18.93 ml/m  AORTIC VALVE LVOT Vmax:   84.40 cm/s LVOT Vmean:  56.100 cm/s LVOT VTI:    0.224 m  AORTA Ao Root diam: 2.70 cm Ao Asc diam:  3.10 cm MITRAL VALVE MV Area (PHT): 5.34 cm     SHUNTS MV Decel Time: 142 msec     Systemic VTI:  0.22 m MV E velocity: 114.00 cm/s  Systemic Diam: 1.80 cm MV A velocity: 120.00 cm/s MV E/A ratio:  0.95 Arvilla Meres MD Electronically signed by Arvilla Meres MD Signature Date/Time: 01/20/2024/3:36:32 PM    Final     Microbiology: Results for orders placed or performed during the hospital encounter of 08/29/21  SARS CORONAVIRUS 2 (TAT 6-24 HRS) Nasopharyngeal Nasopharyngeal Swab     Status: None   Collection Time: 08/29/21  4:24 PM   Specimen: Nasopharyngeal Swab  Result Value Ref Range Status   SARS Coronavirus 2 NEGATIVE NEGATIVE Final    Comment: (NOTE) SARS-CoV-2 target nucleic acids are NOT DETECTED.  The SARS-CoV-2 RNA is generally detectable in upper and  lower respiratory specimens during the acute phase of infection. Negative results do not preclude SARS-CoV-2 infection, do not rule out co-infections with other pathogens, and should not be used as the sole basis for treatment or other patient management decisions. Negative results must be combined with clinical observations, patient history, and epidemiological information. The expected result is Negative.  Fact Sheet for Patients: HairSlick.no  Fact Sheet for Healthcare Providers: quierodirigir.com  This test is not yet approved or cleared by the [place] FDA and  has been authorized for detection and/or diagnosis of SARS-CoV-2 by FDA under an Emergency Use Authorization (EUA). This EUA will remain  in effect (meaning this test can be used) for the duration of the COVID-19 declaration under Se ction 564(b)(1) of the Act, 21 U.S.C. section 360bbb-3(b)(1), unless the authorization is terminated or revoked sooner.  Performed at [facility], [address], [place], [place] [address]   Gastrointestinal Panel by PCR , Stool     Status: None   Collection Time: 08/30/21  4:17 PM   Specimen: Stool  Result Value Ref Range Status   Campylobacter species NOT DETECTED NOT DETECTED Final   Plesimonas shigelloides NOT DETECTED NOT DETECTED Final   Salmonella species NOT DETECTED NOT DETECTED Final   Yersinia enterocolitica NOT DETECTED NOT DETECTED Final   Vibrio species NOT DETECTED NOT DETECTED Final   Vibrio cholerae NOT DETECTED NOT DETECTED Final   Enteroaggregative E coli (EAEC) NOT DETECTED NOT DETECTED Final   Enteropathogenic E coli (EPEC) NOT DETECTED NOT DETECTED Final   Enterotoxigenic E coli (ETEC) NOT DETECTED NOT DETECTED Final   Shiga like toxin producing E coli (STEC) NOT DETECTED NOT DETECTED Final   Shigella/Enteroinvasive E coli (EIEC) NOT DETECTED NOT DETECTED Final   Cryptosporidium NOT DETECTED NOT  DETECTED Final  Cyclospora cayetanensis NOT DETECTED NOT DETECTED Final   Entamoeba histolytica NOT DETECTED NOT DETECTED Final   Giardia lamblia NOT DETECTED NOT DETECTED Final   Adenovirus F40/41 NOT DETECTED NOT DETECTED Final   Astrovirus NOT DETECTED NOT DETECTED Final   Norovirus GI/GII NOT DETECTED NOT DETECTED Final   Rotavirus A NOT DETECTED NOT DETECTED Final   Sapovirus (I, II, IV, and V) NOT DETECTED NOT DETECTED Final    Comment: Performed at [facility], [address] [place], [place] [address]  C Difficile Quick Screen w PCR reflex     Status: Abnormal   Collection Time: 09/01/21  3:00 AM  Result Value Ref Range Status   C Diff antigen NON REACTIVE (A) NEGATIVE Final   C Diff toxin NON REACTIVE (A) NEGATIVE Final   C Diff interpretation NEGATIVE  Final    Comment: Performed at [facility], [address] [place], [place] [address]    Labs: CBC: Recent Labs  Lab 01/30/24 2331 01/31/24 1121  WBC 11.3* 9.3  NEUTROABS  --  5.9  HGB 12.8 11.4*  HCT 40.3 35.7*  MCV 93.3 93.7  PLT 273 218   Basic Metabolic Panel: Recent Labs  Lab 01/30/24 2331 01/31/24 1121  NA 136 138  K 3.7 4.1  CL 102 109  CO2 22 22  GLUCOSE 103* 87  BUN 18 12  CREATININE 0.70 0.66  CALCIUM 9.5 8.9  MG 2.4  --    Liver Function Tests: Recent Labs  Lab 01/30/24 2331  AST 26  ALT 19  ALKPHOS 73  BILITOT 0.2  PROT 7.5  ALBUMIN 4.5   CBG: No results for input(s): "GLUCAP" in the last 168 hours.  Discharge time spent: less than 30 minutes.  Signed: Clydie Braun, MD [facility] 01/31/2024

## 2024-01-31 NOTE — TOC Transition Note (Signed)
 Transition of Care [facility]) - Discharge Note   Patient Details  Name: Virginia Fox MRN: [id number] Date of Birth: 1951-07-12  [facility]) CM/SW Contact:  Leone Haven, RN Phone Number: 01/31/2024, 4:56 PM   Clinical Narrative:    For possible dc, she has no needs.          Patient Goals and CMS Choice            Discharge Placement                       Discharge Plan and Services Additional resources added to the After Visit Summary for                                       Social Drivers of Health (SDOH) Interventions SDOH Screenings   Food Insecurity: No Food Insecurity (03/22/2023)  Housing: Low Risk  (03/22/2023)  Transportation Needs: No Transportation Needs (03/22/2023)  Utilities: Not At Risk (03/22/2023)  Alcohol Screen: Low Risk  (03/22/2023)  Depression (PHQ2-9): Low Risk  (03/22/2023)  Financial Resource Strain: Low Risk  (03/22/2023)  Physical Activity: Sufficiently Active (03/22/2023)  Social Connections: Unknown (03/22/2023)  Recent Concern: Social Connections - Moderately Isolated (03/22/2023)  Stress: No Stress Concern Present (03/22/2023)  Tobacco Use: Low Risk  (01/30/2024)     Readmission Risk Interventions     No data to display

## 2024-01-31 NOTE — ED Notes (Signed)
 [facility] called to consult w/ hospitalist.

## 2024-02-08 DIAGNOSIS — Z85828 Personal history of other malignant neoplasm of skin: Secondary | ICD-10-CM | POA: Diagnosis not present

## 2024-02-08 DIAGNOSIS — D225 Melanocytic nevi of trunk: Secondary | ICD-10-CM | POA: Diagnosis not present

## 2024-02-08 DIAGNOSIS — L814 Other melanin hyperpigmentation: Secondary | ICD-10-CM | POA: Diagnosis not present

## 2024-02-08 DIAGNOSIS — D2371 Other benign neoplasm of skin of right lower limb, including hip: Secondary | ICD-10-CM | POA: Diagnosis not present

## 2024-02-08 DIAGNOSIS — L84 Corns and callosities: Secondary | ICD-10-CM | POA: Diagnosis not present

## 2024-02-08 DIAGNOSIS — Z08 Encounter for follow-up examination after completed treatment for malignant neoplasm: Secondary | ICD-10-CM | POA: Diagnosis not present

## 2024-02-08 DIAGNOSIS — D2262 Melanocytic nevi of left upper limb, including shoulder: Secondary | ICD-10-CM | POA: Diagnosis not present

## 2024-02-08 DIAGNOSIS — L821 Other seborrheic keratosis: Secondary | ICD-10-CM | POA: Diagnosis not present

## 2024-02-08 DIAGNOSIS — D2261 Melanocytic nevi of right upper limb, including shoulder: Secondary | ICD-10-CM | POA: Diagnosis not present

## 2024-02-08 DIAGNOSIS — L578 Other skin changes due to chronic exposure to nonionizing radiation: Secondary | ICD-10-CM | POA: Diagnosis not present

## 2024-02-09 DIAGNOSIS — H903 Sensorineural hearing loss, bilateral: Secondary | ICD-10-CM | POA: Diagnosis not present

## 2024-02-10 DIAGNOSIS — I2510 Atherosclerotic heart disease of native coronary artery without angina pectoris: Secondary | ICD-10-CM | POA: Diagnosis not present

## 2024-02-10 DIAGNOSIS — Z79899 Other long term (current) drug therapy: Secondary | ICD-10-CM | POA: Diagnosis not present

## 2024-02-10 DIAGNOSIS — E782 Mixed hyperlipidemia: Secondary | ICD-10-CM | POA: Diagnosis not present

## 2024-02-11 LAB — LIPID PANEL
Chol/HDL Ratio: 2.4 ratio (ref 0.0–4.4)
Cholesterol, Total: 173 mg/dL (ref 100–199)
HDL: 73 mg/dL (ref >39)
LDL Chol Calc (NIH): 90 mg/dL (ref 0–99)
Triglycerides: 50 mg/dL (ref 0–149)
VLDL Cholesterol Cal: 10 mg/dL (ref 5–40)

## 2024-02-21 ENCOUNTER — Encounter: Payer: Self-pay | Admitting: Internal Medicine

## 2024-02-21 ENCOUNTER — Ambulatory Visit: Payer: PPO | Admitting: Internal Medicine

## 2024-02-21 VITALS — BP 130/68 | HR 65 | Ht 64.0 in | Wt 132.0 lb

## 2024-02-21 DIAGNOSIS — K649 Unspecified hemorrhoids: Secondary | ICD-10-CM

## 2024-02-21 DIAGNOSIS — Z8719 Personal history of other diseases of the digestive system: Secondary | ICD-10-CM

## 2024-02-21 DIAGNOSIS — K5900 Constipation, unspecified: Secondary | ICD-10-CM | POA: Diagnosis not present

## 2024-02-21 DIAGNOSIS — K625 Hemorrhage of anus and rectum: Secondary | ICD-10-CM | POA: Diagnosis not present

## 2024-02-21 DIAGNOSIS — K641 Second degree hemorrhoids: Secondary | ICD-10-CM

## 2024-02-21 DIAGNOSIS — K219 Gastro-esophageal reflux disease without esophagitis: Secondary | ICD-10-CM

## 2024-02-21 NOTE — Patient Instructions (Addendum)
 Please purchase the following medications over the counter and take as directed: Omeprazole 20mg  daily  Continue Miralax  HEMORRHOID BANDING PROCEDURE    FOLLOW-UP CARE   The procedure you have had should have been relatively painless since the banding of the area involved does not have nerve endings and there is no pain sensation.  The rubber band cuts off the blood supply to the hemorrhoid and the band may fall off as soon as 48 hours after the banding (the band may occasionally be seen in the toilet bowl following a bowel movement). You may notice a temporary feeling of fullness in the rectum which should respond adequately to plain Tylenol or Motrin.  Following the banding, avoid strenuous exercise that evening and resume full activity the next day.  A sitz bath (soaking in a warm tub) or bidet is soothing, and can be useful for cleansing the area after bowel movements.     To avoid constipation, take two tablespoons of natural wheat bran, natural oat bran, flax, Benefiber or any over the counter fiber supplement and increase your water intake to 7-8 glasses daily.    Unless you have been prescribed anorectal medication, do not put anything inside your rectum for two weeks: No suppositories, enemas, fingers, etc.  Occasionally, you may have more bleeding than usual after the banding procedure.  This is often from the untreated hemorrhoids rather than the treated one.  Don't be concerned if there is a tablespoon or so of blood.  If there is more blood than this, lie flat with your bottom higher than your head and apply an ice pack to the area. If the bleeding does not stop within a half an hour or if you feel faint, call our office at [phone] or go to the emergency room.  Problems are not common; however, if there is a substantial amount of bleeding, severe pain, chills, fever or difficulty passing urine (very rare) or other problems, you should call us at [phone] or report  to the nearest emergency room.  Do not stay seated continuously for more than 2-3 hours for a day or two after the procedure.  Tighten your buttock muscles 10-15 times every two hours and take 10-15 deep breaths every 1-2 hours.  Do not spend more than a few minutes on the toilet if you cannot empty your bowel; instead re-visit the toilet at a later time.  _______________________________________________________  If your blood pressure at your visit was 140/90 or greater, please contact your primary care physician to follow up on this.  _______________________________________________________  If you are age 52 or older, your body mass index should be between 23-30. Your Body mass index is 22.66 kg/m. If this is out of the aforementioned range listed, please consider follow up with your Primary Care Provider.  If you are age 70 or younger, your body mass index should be between 19-25. Your Body mass index is 22.66 kg/m. If this is out of the aformentioned range listed, please consider follow up with your Primary Care Provider.   ________________________________________________________  The [facility] providers would like to encourage you to use [facility] to communicate with providers for non-urgent requests or questions.  Due to long hold times on the telephone, sending your provider a message by [facility] may be a faster and more efficient way to get a response.  Please allow 48 business hours for a response.  Please remember that this is for non-urgent requests.  _______________________________________________________   Virginia Fox  you for entrusting me with your care and for choosing [facility], Dr. Eulah Pont

## 2024-02-21 NOTE — Progress Notes (Signed)
 Chief Complaint: GERD, dysphagia  HPI : 73 year old female with history of GERD, hiatal hernia, anal fissure presents for follow-up of GERD, dysphagia, A-fib on Eliquis, and constipation  She was hospitalized 08/29/2021 to 09/02/2021 for presumed left-sided ischemic colitis.  She had traveled to [place] and then subsequently developed bloody diarrhea at that time.  During that hospitalization, she was treated with bowel rest, IV fluids, and antibiotics.  She had significant improvement in her symptoms during the course of her hospitalization.  Interval History: She tried the topical steroids, which did help with her hemorrhoids. She would like to try hemorrhoidal banding. She feels less constipated. She doesn't like what the Miralax daily was doing to her since it was causing too many BMs. She can continues to use Miralax PRN. She is just having a little bit of rectal bleeding. Denies straining and sitting on the toilet for long periods of time. Denies rectal pain. She tried the omeprazole 20 mg QD, which seems to working. Denies dysphagia.   Wt Readings from Last 3 Encounters:  02/21/24 132 lb (59.9 kg)  01/30/24 130 lb (59 kg)  01/03/24 132 lb 9.6 oz (60.1 kg)    Past Medical History:  Diagnosis Date   Allergy    Anal fissure    Atherosclerosis of aorta (HCC)    seem on CT scan   Cataract    bilateral cataracts removed   GERD (gastroesophageal reflux disease)    Heart murmur    Hiatal hernia    Internal hemorrhoids    Osteoporosis      Past Surgical History:  Procedure Laterality Date   AUGMENTATION MAMMAPLASTY Bilateral    BASAL CELL CARCINOMA EXCISION     BREAST ENHANCEMENT SURGERY     CESAREAN SECTION     x 2   COLONOSCOPY     WISDOM TOOTH EXTRACTION     Family History  Problem Relation Age of Onset   Multiple sclerosis Mother    Diabetes Father    Hypertension Father    Esophageal cancer Neg Hx    Pancreatic cancer Neg Hx    Stomach cancer Neg Hx    Liver  disease Neg Hx    Rectal cancer Neg Hx    BRCA 1/2 Neg Hx    Colon cancer Neg Hx    Breast cancer Neg Hx    Social History   Tobacco Use   Smoking status: Never    Passive exposure: Never   Smokeless tobacco: Never  Vaping Use   Vaping status: Never Used  Substance Use Topics   Alcohol use: Not Currently    Comment: socially   Drug use: No   Current Outpatient Medications  Medication Sig Dispense Refill   apixaban (ELIQUIS) 5 MG TABS tablet Take 1 tablet (5 mg total) by mouth 2 (two) times daily. 60 tablet 3   Azelaic Acid 15 % gel SMARTSIG:1 sparingly Topical Daily     diltiazem (CARDIZEM CD) 120 MG 24 hr capsule Take 1 capsule (120 mg total) by mouth daily. 30 capsule 0   diltiazem (CARDIZEM) 30 MG tablet Take 1 tablet (30 mg total) by mouth every 6 (six) hours as needed (Heart rates greater than 110). 10 tablet 0   ezetimibe (ZETIA) 10 MG tablet Take 1 tablet (10 mg total) by mouth daily. 90 tablet 3   Loratadine 10 MG CAPS Take 10 mg by mouth daily as needed (allergies).     Multiple Vitamins-Minerals (MULTIVITAMIN WITH MINERALS) tablet Take 1 tablet  by mouth daily.     omeprazole (PRILOSEC) 20 MG capsule Take 20 mg by mouth daily.     Polyethyl Glyc-Propyl Glyc PF (SYSTANE ULTRA PF) 0.4-0.3 % SOLN Place 1 drop into both eyes as needed.     Current Facility-Administered Medications  Medication Dose Route Frequency Provider Last Rate Last Admin   denosumab (PROLIA) injection 60 mg  60 mg Subcutaneous Once Mliss Sax, MD       Allergies  Allergen Reactions   Atorvastatin Other (See Comments)    Myalgias.     Physical Exam: BP 130/68   Pulse 65   Ht 5\' 4"  (1.626 m)   Wt 132 lb (59.9 kg)   BMI 22.66 kg/m  Constitutional: Pleasant,well-developed, female in no acute distress. HEENT: Normocephalic and atraumatic. Conjunctivae are normal. No scleral icterus. Cardiovascular: Normal rate, regular rhythm.  Pulmonary/chest: Effort normal and breath sounds  normal. No wheezing, rales or rhonchi. Abdominal: Soft, nondistended, nontender. Bowel sounds active throughout. There are no masses palpable. No hepatomegaly. Rectal: One grade 2 large prolapsed hemorrhoid that was able to be reduced. This prolapsed hemorrhoid had irritated and denuded mucosa. Extremities: No edema Neurological: Alert and oriented to person place and time. Skin: Skin is warm and dry. No rashes noted. Psychiatric: Normal mood and affect. Behavior is normal.  Labs 09/2021: CBC and CMP were unremarkable  Labs 01/2024: CBC with mildly low Hb of 11.4. CMP nml. TSH nml.   CT A/P w/contrast 08/29/21: IMPRESSION: 1. Left-sided colitis, with appearance and distribution which favor ischemia. Infectious and inflammatory etiologies felt less likely. 2. Small volume right pelvic and cul-de-sac fluid, likely secondary. 3.  Tiny hiatal hernia. 4.  Aortic Atherosclerosis (ICD10-I70.0). 5. 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  Colonoscopy 12/16/21:  Path: Surgical [P], left sided colon biopsy FOCAL ACUTE COLITIS/CRYPTITIS WITH MINIMAL ARCHITECTURAL CHANGES Microscopic Comment Sections show multiple fragments of colonic mucosa exhibiting focal minimal architectural distortion in the form of focal crypt budding. The lamina propria exhibits the normal complement of mononuclear cells with a very focal neutrophilic infiltrate centered around a single crypt. There is no increase in intraepithelial lymphocytes and the collagen table is of normal thickness. No granulomas or parasites are seen. No foreshortening or basal plasmacytosis is evident. There is no evidence of dysplasia or carcinoma. The architectural and inflammatory changes are very focal and nonspecific. The histologic changes of focal active colitis have been most commonly associated with a resolving acute self-limited colitis; however, certain drugs particularly NSAIDs may elicit similar changes.  The focal architectural changes may be associated with diverticular disease or possibly a quiescent/minimally active chronic colitis (not favored). Clinical, endoscopic and microbiologic correlation is recommended.  EGD 08/12/23: - Benign- appearing esophageal stenosis. Dilated. Biopsied. - Normal stomach. - Normal examined duodenum. - Biopsies were taken with a cold forceps for histology in the proximal esophagus and in the distal esophagus. Path: 1. Surgical [P], esophageal - SQUAMOUS MUCOSA WITH PAPILLARY ELONGATION AND BASAL CELL HYPERPLASIA SUGGESTIVE OF REFLUX. 2. Surgical [P], esophageal - SQUAMOUS MUCOSA WITH MILD BASAL CELL HYPERPLASIA.  Flex sig 08/12/23: - Irritated internal hemorrhoids. - The examination was otherwise normal. - Biopsies were taken with a cold forceps from the left transverse colon, descending colon, sigmoid colon and rectum for evaluation of microscopic colitis. Path: 3. Surgical [P], left colon bx - COLONIC MUCOSA WITH NO SIGNIFICANT PATHOLOGY.  ASSESSMENT AND PLAN: Dysphagia - resolved GERD - improved Constipation Rectal bleeding History of ischemic colitis Hemorrhoids  Patient was able to reduce her omeprazole from 40 mg to 20 mg successfully without any exacerbation of her acid reflux.  She still does not have any dysphagia currently.  Her constipation appears to be well-controlled on MiraLAX as needed.  Patient did benefit from topical steroids for hemorrhoids but is interested in hemorrhoidal banding today due to some residual rectal bleeding.  Will plan to proceed with banding today.  - Continue Miralax PRN - Cont omeprazole 20 mg every day. Refill.  - Hemorrhoidal banding today  - RTC 4 weeks for 2nd banding session.   Eulah Pont, MD   PROCEDURE NOTE: The patient presents with symptomatic grade 2  hemorrhoids, requesting rubber band ligation of his/her hemorrhoidal disease.  All risks, benefits and alternative forms of therapy were described and  informed consent was obtained.  In the Left Lateral Decubitus position anoscopic examination revealed grade 2 hemorrhoids with denuded overlying mucosa The anorectum was pre-medicated with nitroglycerin and Recticare The decision was made to band the posterior internal hemorrhoid, and the [facility] O'Regan System was used to perform band ligation without complication.  Digital anorectal examination was then performed to assure proper positioning of the band, and to adjust the banded tissue as required.  The patient was discharged home without pain or other issues.  Dietary and behavioral recommendations were given and along with follow-up instructions.     The following adjunctive treatments were recommended: Sitz bath  The patient will return in 4 weeks for  follow-up and possible additional banding as required. No complications were encountered and the patient tolerated the procedure well.   I spent 36 minutes of time, including in depth chart review, independent review of results as outlined above, communicating results with the patient directly, face-to-face time with the patient, coordinating care, ordering studies and medications as appropriate, and documentation.

## 2024-02-22 ENCOUNTER — Ambulatory Visit (HOSPITAL_COMMUNITY)
Admission: RE | Admit: 2024-02-22 | Discharge: 2024-02-22 | Disposition: A | Discharge status: Home / Self Care | Payer: PPO | Source: Ambulatory Visit | Attending: Physician Assistant | Admitting: Physician Assistant

## 2024-02-22 ENCOUNTER — Encounter (HOSPITAL_COMMUNITY): Payer: Self-pay | Admitting: Physician Assistant

## 2024-02-22 VITALS — BP 136/64 | HR 75 | Ht 64.0 in | Wt 132.0 lb

## 2024-02-22 DIAGNOSIS — D6869 Other thrombophilia: Secondary | ICD-10-CM

## 2024-02-22 DIAGNOSIS — I4892 Unspecified atrial flutter: Secondary | ICD-10-CM | POA: Insufficient documentation

## 2024-02-22 DIAGNOSIS — Z7901 Long term (current) use of anticoagulants: Secondary | ICD-10-CM | POA: Insufficient documentation

## 2024-02-22 DIAGNOSIS — I480 Paroxysmal atrial fibrillation: Secondary | ICD-10-CM

## 2024-02-22 DIAGNOSIS — Z79899 Other long term (current) drug therapy: Secondary | ICD-10-CM | POA: Insufficient documentation

## 2024-02-22 MED ORDER — FLECAINIDE ACETATE 50 MG PO TABS: 50.0000 mg | ORAL_TABLET | Freq: Two times a day (BID) | ORAL | 6 refills | Status: DC

## 2024-02-22 NOTE — Patient Instructions (Signed)
 Start Flecainide 50mg  Twice Daily

## 2024-02-22 NOTE — Progress Notes (Signed)
 Primary Care Physician: Mliss Sax, MD Primary Cardiologist: Elder Negus, MD Electrophysiologist: None  Referring Physician: Dr Orie Fisherman is a 73 y.o. female with a history of aortic atherosclerosis, atrial flutter, atrial fibrillation who presents for follow up in the [facility].  The patient wore a cardiac monitor 01/2024 which detected atrial flutter. On the evening of 2/23, patient reports sudden onset rapid HR >120bpm. Symptoms persisted and she appropriately presented to the [facility] ED where ECG showed afib with RVR. She was transferred to [facility] and converted to SR en route. She was discharged on oral diltiazem. Patient is on Eliquis for stroke prevention.   Patient presents today for follow up for atrial fibrillation. Patient reports that since leaving the ED, she has continued to have episodes of tachypalpitations and activity intolerance. She is in SR today. There are no specific triggers that she can identify.   Today, she denies symptoms of chest pain, shortness of breath, orthopnea, PND, lower extremity edema, dizziness, presyncope, syncope, daytime somnolence, bleeding, or neurologic sequela. The patient is tolerating medications without difficulties and is otherwise without complaint today.    Atrial Fibrillation Risk Factors:  she does not have symptoms or diagnosis of sleep apnea. she does not have a history of rheumatic fever. she does not have a history of alcohol use. The patient does not have a history of early familial atrial fibrillation or other arrhythmias. Sister has afib.   Atrial Fibrillation Management history:  Previous antiarrhythmic drugs: none Previous cardioversions: none Previous ablations: none Anticoagulation history: Eliquis  ROS- All systems are reviewed and negative except as per the HPI above.  Past Medical History:  Diagnosis Date   Allergy    Anal fissure     Atherosclerosis of aorta (HCC)    seem on CT scan   Cataract    bilateral cataracts removed   GERD (gastroesophageal reflux disease)    Heart murmur    Hiatal hernia    Internal hemorrhoids    Osteoporosis     Current Outpatient Medications  Medication Sig Dispense Refill   apixaban (ELIQUIS) 5 MG TABS tablet Take 1 tablet (5 mg total) by mouth 2 (two) times daily. 60 tablet 3   Azelaic Acid 15 % gel SMARTSIG:1 sparingly Topical Daily     diltiazem (CARDIZEM CD) 120 MG 24 hr capsule Take 1 capsule (120 mg total) by mouth daily. 30 capsule 0   diltiazem (CARDIZEM) 30 MG tablet Take 1 tablet (30 mg total) by mouth every 6 (six) hours as needed (Heart rates greater than 110). 10 tablet 0   ezetimibe (ZETIA) 10 MG tablet Take 1 tablet (10 mg total) by mouth daily. 90 tablet 3   Loratadine 10 MG CAPS Take 10 mg by mouth daily as needed (allergies).     Multiple Vitamins-Minerals (MULTIVITAMIN WITH MINERALS) tablet Take 1 tablet by mouth daily.     omeprazole (PRILOSEC) 20 MG capsule Take 20 mg by mouth daily.     Polyethyl Glyc-Propyl Glyc PF (SYSTANE ULTRA PF) 0.4-0.3 % SOLN Place 1 drop into both eyes as needed.     Current Facility-Administered Medications  Medication Dose Route Frequency Provider Last Rate Last Admin   denosumab (PROLIA) injection 60 mg  60 mg Subcutaneous Once Mliss Sax, MD        Physical Exam: BP 136/64   Pulse 75   Ht 5\' 4"  (1.626 m)   Wt 59.9 kg  BMI 22.66 kg/m   GEN: Well nourished, well developed in no acute distress CARDIAC: Regular rate and rhythm, no murmurs, rubs, gallops RESPIRATORY:  Clear to auscultation without rales, wheezing or rhonchi  ABDOMEN: Soft, non-tender, non-distended EXTREMITIES:  No edema; No deformity   Wt Readings from Last 3 Encounters:  02/22/24 59.9 kg  02/21/24 59.9 kg  01/30/24 59 kg     EKG today demonstrates  SR Vent. rate 75 BPM PR interval 156 ms QRS duration 90 ms QT/QTcB 370/413 ms   Echo  01/20/24 demonstrated   1. Left ventricular ejection fraction, by estimation, is 60 to 65%. Left  ventricular ejection fraction by 3D volume is 61 %. The left ventricle has  normal function. The left ventricle has no regional wall motion  abnormalities. Left ventricular diastolic parameters are consistent with Grade I diastolic dysfunction (impaired relaxation). The average left ventricular global longitudinal strain is  -19.3 %. The global longitudinal strain is normal.   2. Right ventricular systolic function is normal. The right ventricular  size is normal.   3. Left atrial size was mildly dilated.   4. The mitral valve is normal in structure. Trivial mitral valve  regurgitation. No evidence of mitral stenosis.   5. The aortic valve is tricuspid. Aortic valve regurgitation is not  visualized. Aortic valve sclerosis/calcification is present, without any  evidence of aortic stenosis.   6. The inferior vena cava is normal in size with greater than 50%  respiratory variability, suggesting right atrial pressure of 3 mmHg.    CHA2DS2-VASc Score = 3  The patient's score is based upon: CHF History: 0 HTN History: 0 Diabetes History: 0 Stroke History: 0 Vascular Disease History: 1 (aortic atherosclerosis) Age Score: 1 Gender Score: 1       ASSESSMENT AND PLAN: Paroxysmal Atrial Fibrillation/atrial flutter The patient's CHA2DS2-VASc score is 3, indicating a 3.2% annual risk of stroke.   General education about afib provided and questions answered. We also discussed her stroke risk and the risks and benefits of anticoagulation. We also discussed rhythm control options including AAD (flecainide, Multaq) and ablation. She would like to start with flecainide. Start flecainide 50 mg BID. She may consider ablation long term.  Continue diltiazem 120 mg daily with 30 mg PRN q 4 hours for heart racing.  Continue Eliquis 5 mg BID  Secondary Hypercoagulable State (ICD10:  D68.69) The patient is  at significant risk for stroke/thromboembolism based upon her CHA2DS2-VASc Score of 3.  Continue Apixaban (Eliquis). No bleeding issues.    Follow up in the AF clinic next week for ECG after flecainide start.        Jorja Loa PA-C Afib Clinic [facility] [address] [place], [place] [address] [phone]

## 2024-02-28 ENCOUNTER — Ambulatory Visit (HOSPITAL_COMMUNITY)
Admission: RE | Admit: 2024-02-28 | Discharge: 2024-02-28 | Disposition: A | Discharge status: Home / Self Care | Source: Ambulatory Visit | Attending: Physician Assistant | Admitting: Physician Assistant

## 2024-02-28 DIAGNOSIS — D6869 Other thrombophilia: Secondary | ICD-10-CM | POA: Diagnosis not present

## 2024-02-28 DIAGNOSIS — I480 Paroxysmal atrial fibrillation: Secondary | ICD-10-CM | POA: Diagnosis not present

## 2024-02-28 DIAGNOSIS — Z79899 Other long term (current) drug therapy: Secondary | ICD-10-CM

## 2024-02-28 DIAGNOSIS — Z5181 Encounter for therapeutic drug level monitoring: Secondary | ICD-10-CM

## 2024-02-28 NOTE — Progress Notes (Signed)
 Patient returns for ECG after flecainide start. ECG shows:   SR Vent. rate 78 BPM PR interval 174 ms QRS duration 104 ms QT/QTcB 386/440 ms  Patient has not had any further palpitations. She is tolerating the medication well. Will order TST to evaluate for QRS widening/arrhythmias on flecainide.    Informed Consent   Shared Decision Making/Informed Consent The risks [chest pain, shortness of breath, cardiac arrhythmias, dizziness, blood pressure fluctuations, myocardial infarction, stroke/transient ischemic attack, and life-threatening complications (estimated to be 1 in 10,000)], benefits (risk stratification, diagnosing coronary artery disease, treatment guidance) and alternatives of an exercise tolerance test were discussed in detail with Ms. Thurow and she agrees to proceed.

## 2024-03-02 ENCOUNTER — Other Ambulatory Visit (HOSPITAL_COMMUNITY): Payer: Self-pay | Admitting: Physician Assistant

## 2024-03-02 DIAGNOSIS — I480 Paroxysmal atrial fibrillation: Secondary | ICD-10-CM

## 2024-03-02 DIAGNOSIS — Z5181 Encounter for therapeutic drug level monitoring: Secondary | ICD-10-CM

## 2024-03-07 ENCOUNTER — Ambulatory Visit: Payer: PPO | Attending: Cardiology | Admitting: Cardiology

## 2024-03-07 ENCOUNTER — Encounter: Payer: Self-pay | Admitting: Cardiology

## 2024-03-07 VITALS — BP 138/70 | HR 79 | Resp 16 | Ht 64.0 in | Wt 131.2 lb

## 2024-03-07 DIAGNOSIS — Z79899 Other long term (current) drug therapy: Secondary | ICD-10-CM | POA: Diagnosis not present

## 2024-03-07 DIAGNOSIS — I480 Paroxysmal atrial fibrillation: Secondary | ICD-10-CM

## 2024-03-07 DIAGNOSIS — I2510 Atherosclerotic heart disease of native coronary artery without angina pectoris: Secondary | ICD-10-CM | POA: Diagnosis not present

## 2024-03-07 DIAGNOSIS — E782 Mixed hyperlipidemia: Secondary | ICD-10-CM

## 2024-03-07 MED ORDER — METOPROLOL TARTRATE 25 MG PO TABS: 25.0000 mg | ORAL_TABLET | Freq: Two times a day (BID) | ORAL | 3 refills | Status: AC

## 2024-03-07 MED ORDER — EZETIMIBE 10 MG PO TABS: 10.0000 mg | ORAL_TABLET | Freq: Every day | ORAL | 3 refills | Status: AC

## 2024-03-07 NOTE — Patient Instructions (Signed)
 Medication Instructions:  Refills sent in.   STOP Diltiazem 120mg    START Metoprolol Tartrate 25 mg take one tablet by mouth twice daily   *If you need a refill on your cardiac medications before your next appointment, please call your pharmacy*  Follow-Up: At [facility], you and your health needs are our priority.  As part of our continuing mission to provide you with exceptional heart care, our providers are all part of one team.  This team includes your primary Cardiologist (physician) and Advanced Practice Providers or APPs (Physician Assistants and Nurse Practitioners) who all work together to provide you with the care you need, when you need it.  Your next appointment:   6 month(s)  Provider:   Elder Negus, MD     We recommend signing up for the patient portal called "MyChart".  Sign up information is provided on this After Visit Summary.  MyChart is used to connect with patients for Virtual Visits (Telemedicine).  Patients are able to view lab/test results, encounter notes, upcoming appointments, etc.  Non-urgent messages can be sent to your provider as well.   To learn more about what you can do with MyChart, go to [email or web address].   Other Instructions       1st Floor: - Lobby - Registration  - Pharmacy  - Lab - Cafe  2nd Floor: - PV Lab - Diagnostic Testing (echo, CT, nuclear med)  3rd Floor: - Vacant  4th Floor: - TCTS (cardiothoracic surgery) - AFib Clinic - Structural Heart Clinic - Vascular Surgery  - Vascular Ultrasound  5th Floor: - HeartCare Cardiology (general and EP) - Clinical Pharmacy for coumadin, hypertension, lipid, weight-loss medications, and med management appointments    Valet parking services will be available as well.

## 2024-03-07 NOTE — Progress Notes (Signed)
 Cardiology Office Note:  .   Date:  03/07/2024  ID:  Virginia Fox, Virginia Fox 1951/03/23, MRN [id number] PCP: Mliss Sax, MD  [facility] Providers Cardiologist:  Truett Mainland, MD PCP: Mliss Sax, MD  Chief Complaint  Patient presents with   Palpitations   Follow-up      History of Present Illness: .    Virginia Fox is a 73 y.o. female with PAF, mixed hyperlipidemia  Workup showed normal LV function, mild left atrial dilatation.  Event monitor showed 4% A-fib burden.  She was seen in A-fib clinic and was started on flecainide and diltiazem.  She has noticed constipation since then.  Except for 1 day, her A-fib seems to have improved since addition of flecainide.    Vitals:   03/07/24 1315  BP: 138/70  Pulse: 79  Resp: 16  SpO2: 97%     ROS:  Review of Systems  Cardiovascular:  Positive for palpitations. Negative for chest pain, dyspnea on exertion, leg swelling and syncope.  Gastrointestinal:  Positive for constipation.     Studies Reviewed: Marland Kitchen        EKG 01/03/2024: Normal sinus rhythm Possible Left atrial enlargement When compared with ECG of 26-Oct-2023 14:49, No significant change was found  Independently interpreted 02/2024: Chol 173, TG 50, HDL 73, LDL 90  10/2023: Hb 11.8 Cr 0.67. Na 134 Trio HS 3 TSH 0.96  Independently interpreted Echocardiogram 01/2024: EF 60 to 65%.  Grade 1 diastolic dysfunction.  GLS -19.3%, normal. Normal RV systolic function. Mild left atrial dilatation. Valvular abnormality.  Event monitor 30 days 01/20/2024 - 02/18/2024: Dominant rhythm: Sinus. HR 55-139 bpm. Avg HR 77 bpm. 4% Afib burden, rate 61-139 bpm 1% PACs, 1% PVCs No SVT/VT/high grade AV block, sinus pause >3sec noted. 2 patient triggered, 9 auto triggered events   CT cardiac scoring 04/2022: Calcium score 0  05/2021: Chol 210, TG 48, HDL 76, LDL 124  Risk Assessment/Calculations:    CHA2DS2-VASc Score = 3   This indicates a 3.2% annual risk of stroke. The patient's score is based upon: CHF History: 0 HTN History: 0 Diabetes History: 0 Stroke History: 0 Vascular Disease History: 1 (aortic atherosclerosis) Age Score: 1 Gender Score: 1    Physical Exam:   Physical Exam Vitals and nursing note reviewed.  Constitutional:      General: She is not in acute distress. Neck:     Vascular: No JVD.  Cardiovascular:     Rate and Rhythm: Normal rate and regular rhythm.     Heart sounds: Normal heart sounds. No murmur heard. Pulmonary:     Effort: Pulmonary effort is normal.     Breath sounds: Normal breath sounds. No wheezing or rales.  Musculoskeletal:     Right lower leg: No edema.     Left lower leg: No edema.      VISIT DIAGNOSES:   ICD-10-CM   1. PAF (paroxysmal atrial fibrillation) (HCC)  I48.0     2. Mixed hyperlipidemia  E78.2         ASSESSMENT AND PLAN: .    Virginia Fox is a 73 y.o. female with PAF, mixed hyperlipidemia  PAF: Symptomatic improvement after initiation of flecainide. Continue flecainide 50 mg twice daily, with AV nodal blocking agent. She has experienced constipation which could be due to diltiazem. Stop diltiazem, start metoprolol tartrate 25 mg twice daily. She has upcoming exercise, stress test. Continue Eliquis 5 mg twice daily.  Mixed hyperlipidemia: Statin intolerant,  LDL down to 90 on Zetia. Continue same.   Meds ordered this encounter  Medications   metoprolol tartrate (LOPRESSOR) 25 MG tablet    Sig: Take 1 tablet (25 mg total) by mouth 2 (two) times daily.    Dispense:  180 tablet    Refill:  3   ezetimibe (ZETIA) 10 MG tablet    Sig: Take 1 tablet (10 mg total) by mouth daily.    Dispense:  90 tablet    Refill:  3     F/u in 6 months  Signed, Elder Negus, MD

## 2024-03-07 NOTE — Addendum Note (Signed)
 Addended by: Neita Goodnight B on: 03/07/2024 01:57 PM   Modules accepted: Orders

## 2024-03-09 ENCOUNTER — Ambulatory Visit: Attending: Cardiology

## 2024-03-09 DIAGNOSIS — I480 Paroxysmal atrial fibrillation: Secondary | ICD-10-CM

## 2024-03-09 LAB — EXERCISE TOLERANCE TEST
Angina Index: 0
Duke Treadmill Score: 7
Estimated workload: 8.6
Exercise duration (min): 7 min
Exercise duration (sec): 3 s
MPHR: 146 {beats}/min
Peak HR: 100 {beats}/min
Percent HR: 68 %
RPE: 15
Rest HR: 62 {beats}/min
ST Depression (mm): 0 mm

## 2024-03-10 ENCOUNTER — Encounter (HOSPITAL_COMMUNITY): Payer: Self-pay

## 2024-04-04 ENCOUNTER — Encounter: Admitting: Internal Medicine

## 2024-04-22 DIAGNOSIS — R21 Rash and other nonspecific skin eruption: Secondary | ICD-10-CM | POA: Diagnosis not present

## 2024-05-08 ENCOUNTER — Other Ambulatory Visit: Payer: Self-pay | Admitting: Cardiology

## 2024-05-08 NOTE — Telephone Encounter (Signed)
 Prescription refill request for Eliquis  received. Indication:afib Last office visit:4/25 Scr:0.66  2/25 Age: 73 Weight:59.5  kg  Prescription refilled

## 2024-05-11 ENCOUNTER — Encounter: Payer: Self-pay | Admitting: Internal Medicine

## 2024-05-11 ENCOUNTER — Ambulatory Visit: Admitting: Internal Medicine

## 2024-05-11 VITALS — BP 126/74 | HR 60 | Ht 64.0 in | Wt 133.0 lb

## 2024-05-11 DIAGNOSIS — K649 Unspecified hemorrhoids: Secondary | ICD-10-CM

## 2024-05-11 DIAGNOSIS — K641 Second degree hemorrhoids: Secondary | ICD-10-CM

## 2024-05-11 NOTE — Progress Notes (Signed)
 PROCEDURE NOTE: The patient presents with symptomatic grade 2  hemorrhoids, requesting rubber band ligation of his/her hemorrhoidal disease.  All risks, benefits and alternative forms of therapy were described and informed consent was obtained.  In the Left Lateral Decubitus position anoscopic examination revealed grade 2 hemorrhoids with one column that was particularly irritated. Hemorrhoids were able to be pushed into the rectum. The anorectum was pre-medicated with nitroglycerin and Recticare The decision was made to band the right anterior internal hemorrhoid, and the [facility] O'Regan System was used to perform band ligation without complication.  Digital anorectal examination was then performed to assure proper positioning of the band, and to adjust the banded tissue as required.  The patient was discharged home without pain or other issues.  Dietary and behavioral recommendations were given and along with follow-up instructions.     The following adjunctive treatments were recommended: Start daily fiber supplement such as [employer] daily stool softener Use squatty potty Discussed pelvic floor PT, patient will consider  The patient will return in 4 weeks for  follow-up and possible additional banding as required. No complications were encountered and the patient tolerated the procedure well.

## 2024-05-11 NOTE — Patient Instructions (Addendum)
 Start taking Sunfiber daily use as directed   Try using a squatty potty  to help with bowel movements    HEMORRHOID BANDING PROCEDURE    FOLLOW-UP CARE   The procedure you have had should have been relatively painless since the banding of the area involved does not have nerve endings and there is no pain sensation.  The rubber band cuts off the blood supply to the hemorrhoid and the band may fall off as soon as 48 hours after the banding (the band may occasionally be seen in the toilet bowl following a bowel movement). You may notice a temporary feeling of fullness in the rectum which should respond adequately to plain Tylenol  or Motrin.  Following the banding, avoid strenuous exercise that evening and resume full activity the next day.  A sitz bath (soaking in a warm tub) or bidet is soothing, and can be useful for cleansing the area after bowel movements.     To avoid constipation, take two tablespoons of natural wheat bran, natural oat bran, flax, Benefiber or any over the counter fiber supplement and increase your water intake to 7-8 glasses daily.    Unless you have been prescribed anorectal medication, do not put anything inside your rectum for two weeks: No suppositories, enemas, fingers, etc.  Occasionally, you may have more bleeding than usual after the banding procedure.  This is often from the untreated hemorrhoids rather than the treated one.  Don't be concerned if there is a tablespoon or so of blood.  If there is more blood than this, lie flat with your bottom higher than your head and apply an ice pack to the area. If the bleeding does not stop within a half an hour or if you feel faint, call our office at [phone] or go to the emergency room.  Problems are not common; however, if there is a substantial amount of bleeding, severe pain, chills, fever or difficulty passing urine (very rare) or other problems, you should call us  at [phone] or report to the nearest  emergency room.  Do not stay seated continuously for more than 2-3 hours for a day or two after the procedure.  Tighten your buttock muscles 10-15 times every two hours and take 10-15 deep breaths every 1-2 hours.  Do not spend more than a few minutes on the toilet if you cannot empty your bowel; instead re-visit the toilet at a later time.   Thank you for entrusting me with your care and for choosing [facility], Dr. Regino Caprio

## 2024-06-14 ENCOUNTER — Encounter: Payer: Self-pay | Admitting: Family Medicine

## 2024-06-14 DIAGNOSIS — M810 Age-related osteoporosis without current pathological fracture: Secondary | ICD-10-CM

## 2024-06-15 ENCOUNTER — Other Ambulatory Visit (HOSPITAL_COMMUNITY): Payer: Self-pay

## 2024-06-15 ENCOUNTER — Ambulatory Visit (HOSPITAL_COMMUNITY)
Admission: RE | Admit: 2024-06-15 | Discharge: 2024-06-15 | Disposition: A | Discharge status: Home / Self Care | Source: Ambulatory Visit | Attending: Physician Assistant | Admitting: Physician Assistant

## 2024-06-15 ENCOUNTER — Encounter (HOSPITAL_COMMUNITY): Payer: Self-pay | Admitting: Physician Assistant

## 2024-06-15 ENCOUNTER — Other Ambulatory Visit: Payer: Self-pay

## 2024-06-15 VITALS — BP 134/76 | HR 60 | Ht 64.0 in | Wt 133.8 lb

## 2024-06-15 DIAGNOSIS — I480 Paroxysmal atrial fibrillation: Secondary | ICD-10-CM

## 2024-06-15 DIAGNOSIS — Z79899 Other long term (current) drug therapy: Secondary | ICD-10-CM | POA: Diagnosis not present

## 2024-06-15 DIAGNOSIS — L570 Actinic keratosis: Secondary | ICD-10-CM | POA: Diagnosis not present

## 2024-06-15 DIAGNOSIS — D6869 Other thrombophilia: Secondary | ICD-10-CM | POA: Diagnosis not present

## 2024-06-15 DIAGNOSIS — Z5181 Encounter for therapeutic drug level monitoring: Secondary | ICD-10-CM | POA: Diagnosis not present

## 2024-06-15 MED ORDER — DENOSUMAB 60 MG/ML ~~LOC~~ SOSY
60.0000 mg | PREFILLED_SYRINGE | Freq: Once | SUBCUTANEOUS | 0 refills | Status: DC
  Filled 2024-06-15 – 2024-06-16 (×2): qty 1, 180d supply, fill #0

## 2024-06-15 NOTE — Progress Notes (Signed)
 Patient to be enrolled with [facility]. Routed to Tiffany.

## 2024-06-15 NOTE — Progress Notes (Signed)
 Primary Care Physician: Berneta Elsie Sayre, MD Primary Cardiologist: Newman Virginia Lawrence, MD Electrophysiologist: None  Referring Physician: Dr Shlomo Deeann [name] is a 73 y.o. female with a history of aortic atherosclerosis, atrial flutter, atrial fibrillation who presents for follow up in the [facility].  The patient wore a cardiac monitor 01/2024 which detected atrial flutter. On the evening of 2/23, patient reports sudden onset rapid HR >120bpm. Symptoms persisted and she appropriately presented to the [facility] ED where ECG showed afib with RVR. She was transferred to [facility] and converted to SR en route. She was discharged on oral diltiazem . Patient is on Eliquis  for stroke prevention. Started on flecainide  02/22/24. ETT showed no QRS widening or induced arrhythmias on flecainide .   Patient returns for follow up for atrial fibrillation and flecainide  monitoring. She reports that she has done well since her last visit with no interim symptoms of afib. No bleeding issues on anticoagulation.   Today, she  denies symptoms of palpitations, chest pain, shortness of breath, orthopnea, PND, lower extremity edema, dizziness, presyncope, syncope, snoring, daytime somnolence, bleeding, or neurologic sequela. The patient is tolerating medications without difficulties and is otherwise without complaint today.    Atrial Fibrillation Risk Factors:  she does not have symptoms or diagnosis of sleep apnea. she does not have a history of rheumatic fever. she does not have a history of alcohol use. The patient does not have a history of early familial atrial fibrillation or other arrhythmias. Sister has afib.   Atrial Fibrillation Management history:  Previous antiarrhythmic drugs: flecainide   Previous cardioversions: none Previous ablations: none Anticoagulation history: Eliquis   ROS- All systems are reviewed and negative except as per the HPI above.  Past  Medical History:  Diagnosis Date   Allergy    Anal fissure    Atherosclerosis of aorta (HCC)    seem on CT scan   Cataract    bilateral cataracts removed   GERD (gastroesophageal reflux disease)    Heart murmur    Hiatal hernia    Internal hemorrhoids    Osteoporosis     Current Outpatient Medications  Medication Sig Dispense Refill   apixaban  (ELIQUIS ) 5 MG TABS tablet Take 1 tablet by mouth twice daily 60 tablet 5   Azelaic Acid 15 % gel SMARTSIG:1 sparingly Topical Daily     ezetimibe  (ZETIA ) 10 MG tablet Take 1 tablet (10 mg total) by mouth daily. 90 tablet 3   flecainide  (TAMBOCOR ) 50 MG tablet Take 1 tablet (50 mg total) by mouth 2 (two) times daily. 60 tablet 6   Loratadine 10 MG CAPS Take 10 mg by mouth daily as needed (allergies).     metoprolol  tartrate (LOPRESSOR ) 25 MG tablet Take 1 tablet (25 mg total) by mouth 2 (two) times daily. 180 tablet 3   Multiple Vitamins-Minerals (MULTIVITAMIN WITH MINERALS) tablet Take 1 tablet by mouth daily.     omeprazole  (PRILOSEC) 20 MG capsule Take 20 mg by mouth daily.     Polyethyl Glyc-Propyl Glyc PF (SYSTANE ULTRA PF) 0.4-0.3 % SOLN Place 1 drop into both eyes as needed.     Current Facility-Administered Medications  Medication Dose Route Frequency Provider Last Rate Last Admin   denosumab  (PROLIA ) injection 60 mg  60 mg Subcutaneous Once Berneta Elsie Sayre, MD        Physical Exam: BP 134/76   Pulse 60   Ht 5' 4 (1.626 m)   Wt 60.7 kg  BMI 22.97 kg/m   GEN: Well nourished, well developed in no acute distress CARDIAC: Regular rate and rhythm, no murmurs, rubs, gallops RESPIRATORY:  Clear to auscultation without rales, wheezing or rhonchi  ABDOMEN: Soft, non-tender, non-distended EXTREMITIES:  No edema; No deformity    Wt Readings from Last 3 Encounters:  06/15/24 60.7 kg  05/11/24 60.3 kg  03/07/24 59.5 kg     EKG today demonstrates  SR Vent. rate 60 BPM PR interval 168 ms QRS duration 94 ms QT/QTcB  404/404 ms   Echo 01/20/24 demonstrated   1. Left ventricular ejection fraction, by estimation, is 60 to 65%. Left  ventricular ejection fraction by 3D volume is 61 %. The left ventricle has  normal function. The left ventricle has no regional wall motion  abnormalities. Left ventricular diastolic parameters are consistent with Grade I diastolic dysfunction (impaired relaxation). The average left ventricular global longitudinal strain is  -19.3 %. The global longitudinal strain is normal.   2. Right ventricular systolic function is normal. The right ventricular  size is normal.   3. Left atrial size was mildly dilated.   4. The mitral valve is normal in structure. Trivial mitral valve  regurgitation. No evidence of mitral stenosis.   5. The aortic valve is tricuspid. Aortic valve regurgitation is not  visualized. Aortic valve sclerosis/calcification is present, without any  evidence of aortic stenosis.   6. The inferior vena cava is normal in size with greater than 50%  respiratory variability, suggesting right atrial pressure of 3 mmHg.    CHA2DS2-VASc Score = 3  The patient's score is based upon: CHF History: 0 HTN History: 0 Diabetes History: 0 Stroke History: 0 Vascular Disease History: 1 (aortic atherosclerosis) Age Score: 1 Gender Score: 1       ASSESSMENT AND PLAN: Paroxysmal Atrial Fibrillation/atrial flutter (ICD10:  I48.0) The patient's CHA2DS2-VASc score is 3, indicating a 3.2% annual risk of stroke.   Patient appears to be maintaining SR Continue flecainide  50 mg BID Continue Lopressor  25 mg BID Continue Eliquis  5 mg bID  Secondary Hypercoagulable State (ICD10:  D68.69) The patient is at significant risk for stroke/thromboembolism based upon her CHA2DS2-VASc Score of 3.  Continue Apixaban  (Eliquis ). No bleeding issues.   High Risk Medication Monitoring (ICD 10: Z79.899) Intervals on ECG acceptable for flecainide  monitoring.       Follow up in the AF clinic  in 6 months.      Daril Kicks PA-C [facility] [address] [place], [place] [address] [phone]

## 2024-06-16 ENCOUNTER — Other Ambulatory Visit (HOSPITAL_COMMUNITY): Payer: Self-pay

## 2024-06-16 ENCOUNTER — Other Ambulatory Visit: Payer: Self-pay

## 2024-06-16 NOTE — Progress Notes (Signed)
 Pharmacy Patient Advocate Encounter  Insurance verification completed.   The patient is insured through [employer] [employer] ADVANTAGE/RX ADVANCE   Ran test claim for Prolia. Co-pay is $250.   This test claim was processed through [facility]- copay amounts may vary at other pharmacies due to pharmacy/plan contracts, or as the patient moves through the different stages of their insurance plan.

## 2024-06-16 NOTE — Progress Notes (Signed)
 Specialty Pharmacy Initial Fill Coordination Note  Virginia Fox is a 73 y.o. female contacted today regarding initial fill of specialty medication(s) Denosumab  (PROLIA )   Patient requested Courier to Provider Office   Delivery date: 06/20/24   Verified address: [facility]   Medication will be filled on 7/14.   Patient is aware of $250 copayment.

## 2024-06-19 ENCOUNTER — Other Ambulatory Visit: Payer: Self-pay

## 2024-06-20 ENCOUNTER — Other Ambulatory Visit: Payer: Self-pay

## 2024-06-20 ENCOUNTER — Telehealth: Payer: Self-pay

## 2024-06-20 NOTE — Telephone Encounter (Signed)
 Prolia  received today from pharmacy and placed in refrigerator.

## 2024-06-20 NOTE — Telephone Encounter (Signed)
 Appt Desk Note: Prolia  Injection. COPAY $0. PATIENT-SUPPLIED. Clinical: Drop the admin and medication charge in the Wrap-Up tab.

## 2024-06-22 ENCOUNTER — Ambulatory Visit

## 2024-06-22 DIAGNOSIS — M810 Age-related osteoporosis without current pathological fracture: Secondary | ICD-10-CM

## 2024-06-22 MED ORDER — DENOSUMAB 60 MG/ML ~~LOC~~ SOSY: 60.0000 mg | PREFILLED_SYRINGE | Freq: Once | SUBCUTANEOUS | 0 refills | Status: AC

## 2024-06-22 NOTE — Progress Notes (Signed)
 Per orders of Dr Berneta, injection of Prolia  given in the LT arm SQ by [facility], cma.  Patient tolerated injection well.  Dm/cma   LO# [id number] Exp:  12/06/2026 NDC# [id number]

## 2024-06-30 ENCOUNTER — Ambulatory Visit: Admitting: Internal Medicine

## 2024-06-30 ENCOUNTER — Encounter: Payer: Self-pay | Admitting: Internal Medicine

## 2024-06-30 VITALS — BP 130/68 | HR 68 | Ht 64.0 in | Wt 135.5 lb

## 2024-06-30 DIAGNOSIS — K641 Second degree hemorrhoids: Secondary | ICD-10-CM | POA: Diagnosis not present

## 2024-06-30 DIAGNOSIS — K649 Unspecified hemorrhoids: Secondary | ICD-10-CM

## 2024-06-30 MED ORDER — OMEPRAZOLE 40 MG PO CPDR: 40.0000 mg | DELAYED_RELEASE_CAPSULE | Freq: Every day | ORAL | 1 refills | Status: DC

## 2024-06-30 NOTE — Progress Notes (Signed)
 PROCEDURE NOTE: The patient presents with symptomatic grade 2  hemorrhoids, requesting rubber band ligation of his/her hemorrhoidal disease.  All risks, benefits and alternative forms of therapy were described and informed consent was obtained.  Rectal bleeding has improved slightly. Still having some rectal soreness  In the Left Lateral Decubitus position anoscopic examination revealed grade 2 hemorrhoids, with one area prolapsed and with some denuded mucosa The anorectum was pre-medicated with nitroglycerin and Recticare The decision was made to band the left anterior internal hemorrhoid, and the [facility] O'Regan System was used to perform band ligation without complication.  Digital anorectal examination was then performed to assure proper positioning of the band, and to adjust the banded tissue as required.  The patient was discharged home without pain or other issues.  Dietary and behavioral recommendations were given and along with follow-up instructions.     The following adjunctive treatments were recommended: Sitz baths Recticare samples given today Consider referral to colorectal surgery in the future  The patient will return in 6 months for follow-up of GERD No complications were encountered and the patient tolerated the procedure well.

## 2024-06-30 NOTE — Patient Instructions (Addendum)
 HEMORRHOID BANDING PROCEDURE    FOLLOW-UP CARE   The procedure you have had should have been relatively painless since the banding of the area involved does not have nerve endings and there is no pain sensation.  The rubber band cuts off the blood supply to the hemorrhoid and the band may fall off as soon as 48 hours after the banding (the band may occasionally be seen in the toilet bowl following a bowel movement). You may notice a temporary feeling of fullness in the rectum which should respond adequately to plain Tylenol  or Motrin.  Following the banding, avoid strenuous exercise that evening and resume full activity the next day.  A sitz bath (soaking in a warm tub) or bidet is soothing, and can be useful for cleansing the area after bowel movements.     To avoid constipation, take two tablespoons of natural wheat bran, natural oat bran, flax, Benefiber or any over the counter fiber supplement and increase your water intake to 7-8 glasses daily.    Unless you have been prescribed anorectal medication, do not put anything inside your rectum for two weeks: No suppositories, enemas, fingers, etc.  Occasionally, you may have more bleeding than usual after the banding procedure.  This is often from the untreated hemorrhoids rather than the treated one.  Don't be concerned if there is a tablespoon or so of blood.  If there is more blood than this, lie flat with your bottom higher than your head and apply an ice pack to the area. If the bleeding does not stop within a half an hour or if you feel faint, call our office at [phone] or go to the emergency room.  Problems are not common; however, if there is a substantial amount of bleeding, severe pain, chills, fever or difficulty passing urine (very rare) or other problems, you should call us  at [phone] or report to the nearest emergency room.  Do not stay seated continuously for more than 2-3 hours for a day or two after the procedure.   Tighten your buttock muscles 10-15 times every two hours and take 10-15 deep breaths every 1-2 hours.  Do not spend more than a few minutes on the toilet if you cannot empty your bowel; instead re-visit the toilet at a later time.  We have sent the following medications to your pharmacy for you to pick up at your convenience: Omeprazole     Thank you for entrusting me with your care and for choosing [facility], Dr. Estefana Kidney

## 2024-07-03 ENCOUNTER — Ambulatory Visit (INDEPENDENT_AMBULATORY_CARE_PROVIDER_SITE_OTHER)

## 2024-07-03 DIAGNOSIS — Z0000 Encounter for general adult medical examination without abnormal findings: Secondary | ICD-10-CM | POA: Diagnosis not present

## 2024-07-03 NOTE — Patient Instructions (Signed)
 [name] , Thank you for taking time out of your busy schedule to complete your Annual Wellness Visit with me. I enjoyed our conversation and look forward to speaking with you again next year. I, as well as your care team,  appreciate your ongoing commitment to your health goals. Please review the following plan we discussed and let me know if I can assist you in the future. Your Game plan/ To Do List    Referrals: If you haven't heard from the office you've been referred to, please reach out to them at the phone provided.  N/a Follow up Visits: Next Medicare AWV with our clinical staff: 07/06/2025 at 2:20   Have you seen your provider in the last 6 months (3 months if uncontrolled diabetes)? Yes Next Office Visit with your provider: 07/10/2024 at 1:20  Clinician Recommendations:  Aim for 30 minutes of exercise or brisk walking, 6-8 glasses of water, and 5 servings of fruits and vegetables each day.       This is a list of the screening recommended for you and due dates:  Health Maintenance  Topic Date Due   DTaP/Tdap/Td vaccine (2 - Td or Tdap) 02/19/2024   Flu Shot  07/07/2024   Mammogram  11/18/2024   Medicare Annual Wellness Visit  07/03/2025   DEXA scan (bone density measurement)  Completed   Hepatitis C Screening  Completed   Zoster (Shingles) Vaccine  Completed   Hepatitis B Vaccine  Aged Out   HPV Vaccine  Aged Out   Meningitis B Vaccine  Aged Out   Pneumococcal Vaccine for age over 80  Discontinued   Colon Cancer Screening  Discontinued   COVID-19 Vaccine  Discontinued    Advanced directives: (Copy Requested) Please bring a copy of your health care power of attorney and living will to the office to be added to your chart at your convenience. You can mail to [facility] [address] [place], [place] [address] or email to [email or web address] Advance Care Planning is important because it:  [x]  Makes sure you receive the medical care that is  consistent with your values, goals, and preferences  [x]  It provides guidance to your family and loved ones and reduces their decisional burden about whether or not they are making the right decisions based on your wishes.  Follow the link provided in your after visit summary or read over the paperwork we have mailed to you to help you started getting your Advance Directives in place. If you need assistance in completing these, please reach out to us  so that we can help you!  See attachments for Preventive Care and Fall Prevention Tips.

## 2024-07-03 NOTE — Progress Notes (Signed)
 Subjective:   Virginia Fox is a 73 y.o. who presents for a Medicare Wellness preventive visit.  As a reminder, Annual Wellness Visits don't include a physical exam, and some assessments may be limited, especially if this visit is performed virtually. We may recommend an in-person follow-up visit with your provider if needed.  Visit Complete: Virtual I connected with  Leannah K Steinborn on 07/03/24 by a audio enabled telemedicine application and verified that I am speaking with the correct person using two identifiers.  Patient Location: Home  Provider Location: Office/Clinic  I discussed the limitations of evaluation and management by telemedicine. The patient expressed understanding and agreed to proceed.  Vital Signs: Because this visit was a virtual/telehealth visit, some criteria may be missing or patient reported. Any vitals not documented were not able to be obtained and vitals that have been documented are patient reported.  VideoError- Librarian, academic were attempted between this provider and patient, however failed, due to patient having technical difficulties OR patient did not have access to video capability.  We continued and completed visit with audio only.   Persons Participating in Visit: Patient.  AWV Questionnaire: Yes: Patient Medicare AWV questionnaire was completed by the patient on 07/03/2024; I have confirmed that all information answered by patient is correct and no changes since this date.  Cardiac Risk Factors include: advanced age (>50men, >68 women)     Objective:    Today's Vitals   There is no height or weight on file to calculate BMI.     07/03/2024    2:19 PM 01/30/2024   10:34 PM 10/26/2023    2:45 PM 03/22/2023    1:59 PM 04/22/2022   12:55 PM 08/29/2021    6:14 PM 03/25/2021   12:47 PM  Advanced Directives  Does Patient Have a Medical Advance Directive? Yes No No Yes Yes Yes Yes  Type of Special educational needs teacher of [place];Living will   Healthcare Power of [employer] of [place];Living will Healthcare Power of [employer] of [place];Living will  Does patient want to make changes to medical advance directive?      No - Patient declined   Copy of Healthcare Power of Attorney in Chart? No - copy requested   No - copy requested No - copy requested No - copy requested No - copy requested    Current Medications (verified) Outpatient Encounter Medications as of 07/03/2024  Medication Sig   apixaban  (ELIQUIS ) 5 MG TABS tablet Take 1 tablet by mouth twice daily   Azelaic Acid 15 % gel SMARTSIG:1 sparingly Topical Daily   ezetimibe  (ZETIA ) 10 MG tablet Take 1 tablet (10 mg total) by mouth daily.   flecainide  (TAMBOCOR ) 50 MG tablet Take 1 tablet (50 mg total) by mouth 2 (two) times daily.   Loratadine 10 MG CAPS Take 10 mg by mouth daily as needed (allergies).   metoprolol  tartrate (LOPRESSOR ) 25 MG tablet Take 1 tablet (25 mg total) by mouth 2 (two) times daily.   Multiple Vitamins-Minerals (MULTIVITAMIN WITH MINERALS) tablet Take 1 tablet by mouth daily.   omeprazole  (PRILOSEC) 40 MG capsule Take 1 capsule (40 mg total) by mouth daily.   Polyethyl Glyc-Propyl Glyc PF (SYSTANE ULTRA PF) 0.4-0.3 % SOLN Place 1 drop into both eyes as needed.   No facility-administered encounter medications on file as of 07/03/2024.    Allergies (verified) Lipitor [atorvastatin ]   History: Past Medical History:  Diagnosis Date   Allergy  Anal fissure    Atherosclerosis of aorta (HCC)    seem on CT scan   Cataract    bilateral cataracts removed   GERD (gastroesophageal reflux disease)    Heart murmur    Hiatal hernia    Internal hemorrhoids    Osteoporosis    Past Surgical History:  Procedure Laterality Date   AUGMENTATION MAMMAPLASTY Bilateral    BASAL CELL CARCINOMA EXCISION     BREAST ENHANCEMENT SURGERY     CESAREAN SECTION     x 2   COLONOSCOPY      COSMETIC SURGERY  2006, 2011   Breast implants   EYE SURGERY  2015   Cataract removal   TUBAL LIGATION  1975   WISDOM TOOTH EXTRACTION     Family History  Problem Relation Age of Onset   Multiple sclerosis Mother    Early death Mother    Diabetes Father    Hypertension Father    Heart attack Father    Alcohol abuse Father    Multiple sclerosis Sister    Multiple sclerosis Sister    Prostate cancer Brother    Multiple sclerosis Daughter    Arthritis Paternal Grandmother    Esophageal cancer Neg Hx    Pancreatic cancer Neg Hx    Stomach cancer Neg Hx    Liver disease Neg Hx    Rectal cancer Neg Hx    BRCA 1/2 Neg Hx    Colon cancer Neg Hx    Breast cancer Neg Hx    Social History   Socioeconomic History   Marital status: Widowed    Spouse name: Not on file   Number of children: Not on file   Years of education: Not on file   Highest education level: Some college, no degree  Occupational History   Occupation: Retired  Tobacco Use   Smoking status: Never    Passive exposure: Never   Smokeless tobacco: Never   Tobacco comments:    Never smoked 02/22/24  Vaping Use   Vaping status: Never Used  Substance and Sexual Activity   Alcohol use: Not Currently    Comment: socially   Drug use: No   Sexual activity: Not Currently    Birth control/protection: Post-menopausal  Other Topics Concern   Not on file  Social History Narrative   Not on file   Social Drivers of Health   Financial Resource Strain: Low Risk  (07/03/2024)   Overall Financial Resource Strain (CARDIA)    Difficulty of Paying Living Expenses: Not hard at all  Food Insecurity: No Food Insecurity (07/03/2024)   Hunger Vital Sign    Worried About Running Out of Food in the Last Year: Never true    Ran Out of Food in the Last Year: Never true  Transportation Needs: No Transportation Needs (07/03/2024)   PRAPARE - Administrator, Civil Service (Medical): No    Lack of Transportation  (Non-Medical): No  Physical Activity: Sufficiently Active (07/03/2024)   Exercise Vital Sign    Days of Exercise per Week: 5 days    Minutes of Exercise per Session: 30 min  Stress: No Stress Concern Present (07/03/2024)   [employer] of Occupational Health - Occupational Stress Questionnaire    Feeling of Stress: Not at all  Social Connections: Moderately Isolated (07/03/2024)   Social Connection and Isolation Panel    Frequency of Communication with Friends and Family: More than three times a week    Frequency of Social Gatherings  with Friends and Family: More than three times a week    Attends Religious Services: Never    Active Member of Clubs or Organizations: Yes    Attends Banker Meetings: More than 4 times per year    Marital Status: Widowed    Tobacco Counseling Counseling given: Not Answered Tobacco comments: Never smoked 02/22/24    Clinical Intake:  Pre-visit preparation completed: Yes  Pain : No/denies pain     Nutritional Risks: None Diabetes: No  No results found for: HGBA1C   How often do you need to have someone help you when you read instructions, pamphlets, or other written materials from your doctor or pharmacy?: 1 - Never  Interpreter Needed?: No  Information entered by :: NAllen LPN   Activities of Daily Living     07/03/2024    8:32 AM  In your present state of health, do you have any difficulty performing the following activities:  Hearing? 0  Vision? 0  Difficulty concentrating or making decisions? 0  Walking or climbing stairs? 0  Dressing or bathing? 0  Doing errands, shopping? 0  Preparing Food and eating ? N  Using the Toilet? N  In the past six months, have you accidently leaked urine? N  Do you have problems with loss of bowel control? N  Managing your Medications? N  Managing your Finances? N  Housekeeping or managing your Housekeeping? N    Patient Care Team: Berneta Elsie Sayre, MD as PCP - General  (Family Medicine) Elmira Newman PARAS, MD as PCP - Cardiology (Cardiology) Waylan Cain, MD as Consulting Physician (Ophthalmology)  I have updated your Care Teams any recent Medical Services you may have received from other providers in the past year.     Assessment:   This is a routine wellness examination for Aliviah.  Hearing/Vision screen Hearing Screening - Comments:: Denies hearing issues Vision Screening - Comments:: Regular eye exams, [facility]   Goals Addressed             This Visit's Progress    Patient Stated       07/03/2024, wants to do more exercise       Depression Screen     07/03/2024    2:20 PM 03/22/2023    2:00 PM 07/09/2022    1:05 PM 04/22/2022   12:56 PM 04/22/2022   12:54 PM 04/07/2022    1:09 PM 12/23/2021    1:01 PM  PHQ 2/9 Scores  PHQ - 2 Score 0 0 0 0 0 0 0  PHQ- 9 Score 0          Fall Risk     07/03/2024    8:32 AM 03/22/2023    8:04 AM 07/09/2022    1:05 PM 04/22/2022   12:56 PM 04/07/2022    1:09 PM  Fall Risk   Falls in the past year? 0 0 0 0 0  Number falls in past yr: 0 0 0 0 0  Injury with Fall? 0 0  0   Risk for fall due to : Medication side effect Medication side effect     Follow up Falls prevention discussed;Falls evaluation completed Falls prevention discussed;Education provided;Falls evaluation completed  Falls evaluation completed       Data saved with a previous flowsheet row definition    MEDICARE RISK AT HOME:  Medicare Risk at Home Any stairs in or around the home?: (Patient-Rptd) No Home free of loose throw rugs in walkways, pet beds,  electrical cords, etc?: (Patient-Rptd) Yes Adequate lighting in your home to reduce risk of falls?: (Patient-Rptd) Yes Life alert?: (Patient-Rptd) No Use of a cane, walker or w/c?: (Patient-Rptd) No Grab bars in the bathroom?: (Patient-Rptd) No Shower chair or bench in shower?: (Patient-Rptd) Yes Elevated toilet seat or a handicapped toilet?: (Patient-Rptd) No  TIMED UP AND  GO:  Was the test performed?  No  Cognitive Function: 6CIT completed        07/03/2024    2:20 PM 03/22/2023    2:01 PM  6CIT Screen  What Year? 0 points 0 points  What month? 0 points 0 points  What time? 0 points 0 points  Count back from 20 0 points 0 points  Months in reverse 0 points 0 points  Repeat phrase 0 points 0 points  Total Score 0 points 0 points    Immunizations Immunization History  Administered Date(s) Administered   Fluad Quad(high Dose 65+) 12/18/2020, 09/17/2021, 08/28/2022   Influenza Split 01/05/2017   Influenza, High Dose Seasonal PF 09/27/2019   Influenza,inj,Quad PF,6+ Mos 01/05/2017, 09/23/2017, 10/14/2018   PFIZER(Purple Top)SARS-COV-2 Vaccination 01/14/2020, 02/08/2020, 09/19/2020   Pneumococcal Conjugate-13 05/22/2021   Tdap 02/18/2014   Zoster Recombinant(Shingrix) 01/16/2022, 05/13/2022    Screening Tests Health Maintenance  Topic Date Due   DTaP/Tdap/Td (2 - Td or Tdap) 02/19/2024   INFLUENZA VACCINE  07/07/2024   MAMMOGRAM  11/18/2024   Medicare Annual Wellness (AWV)  07/03/2025   DEXA SCAN  Completed   Hepatitis C Screening  Completed   Zoster Vaccines- Shingrix  Completed   Hepatitis B Vaccines  Aged Out   HPV VACCINES  Aged Out   Meningococcal B Vaccine  Aged Out   Pneumococcal Vaccine: 50+ Years  Discontinued   Colonoscopy  Discontinued   COVID-19 Vaccine  Discontinued    Health Maintenance  Health Maintenance Due  Topic Date Due   DTaP/Tdap/Td (2 - Td or Tdap) 02/19/2024   Health Maintenance Items Addressed: Due for TDAP vaccine.  Additional Screening:  Vision Screening: Recommended annual ophthalmology exams for early detection of glaucoma and other disorders of the eye. Would you like a referral to an eye doctor? No    Dental Screening: Recommended annual dental exams for proper oral hygiene  Community Resource Referral / Chronic Care Management: CRR required this visit?  No   CCM required this visit?   No   Plan:    I have personally reviewed and noted the following in the patient's chart:   Medical and social history Use of alcohol, tobacco or illicit drugs  Current medications and supplements including opioid prescriptions. Patient is not currently taking opioid prescriptions. Functional ability and status Nutritional status Physical activity Advanced directives List of other physicians Hospitalizations, surgeries, and ER visits in previous 12 months Vitals Screenings to include cognitive, depression, and falls Referrals and appointments  In addition, I have reviewed and discussed with patient certain preventive protocols, quality metrics, and best practice recommendations. A written personalized care plan for preventive services as well as general preventive health recommendations were provided to patient.   Ardella FORBES Dawn, LPN   2/71/7974   After Visit Summary: (MyChart) Due to this being a telephonic visit, the after visit summary with patients personalized plan was offered to patient via MyChart   Notes: Nothing significant to report at this time.

## 2024-07-07 DIAGNOSIS — H524 Presbyopia: Secondary | ICD-10-CM | POA: Diagnosis not present

## 2024-07-07 DIAGNOSIS — Z961 Presence of intraocular lens: Secondary | ICD-10-CM | POA: Diagnosis not present

## 2024-07-07 DIAGNOSIS — H1789 Other corneal scars and opacities: Secondary | ICD-10-CM | POA: Diagnosis not present

## 2024-07-10 ENCOUNTER — Encounter: Payer: Self-pay | Admitting: Family Medicine

## 2024-07-10 ENCOUNTER — Ambulatory Visit (INDEPENDENT_AMBULATORY_CARE_PROVIDER_SITE_OTHER): Admitting: Family Medicine

## 2024-07-10 VITALS — BP 118/72 | HR 63 | Temp 96.9°F | Ht 64.0 in | Wt 135.8 lb

## 2024-07-10 DIAGNOSIS — D649 Anemia, unspecified: Secondary | ICD-10-CM | POA: Diagnosis not present

## 2024-07-10 DIAGNOSIS — Z131 Encounter for screening for diabetes mellitus: Secondary | ICD-10-CM

## 2024-07-10 DIAGNOSIS — Z23 Encounter for immunization: Secondary | ICD-10-CM

## 2024-07-10 LAB — CBC WITH DIFFERENTIAL/PLATELET
Basophils Absolute: 0.1 K/uL (ref 0.0–0.1)
Basophils Relative: 0.6 % (ref 0.0–3.0)
Eosinophils Absolute: 0.1 K/uL (ref 0.0–0.7)
Eosinophils Relative: 0.9 % (ref 0.0–5.0)
HCT: 36.1 % (ref 36.0–46.0)
Hemoglobin: 11.7 g/dL — ABNORMAL LOW (ref 12.0–15.0)
Lymphocytes Relative: 22.6 % (ref 12.0–46.0)
Lymphs Abs: 1.9 K/uL (ref 0.7–4.0)
MCHC: 32.5 g/dL (ref 30.0–36.0)
MCV: 91.2 fl (ref 78.0–100.0)
Monocytes Absolute: 1.2 K/uL — ABNORMAL HIGH (ref 0.1–1.0)
Monocytes Relative: 14.5 % — ABNORMAL HIGH (ref 3.0–12.0)
Neutro Abs: 5.2 K/uL (ref 1.4–7.7)
Neutrophils Relative %: 61.4 % (ref 43.0–77.0)
Platelets: 232 K/uL (ref 150.0–400.0)
RBC: 3.96 Mil/uL (ref 3.87–5.11)
RDW: 13.5 % (ref 11.5–15.5)
WBC: 8.5 K/uL (ref 4.0–10.5)

## 2024-07-10 LAB — COMPREHENSIVE METABOLIC PANEL WITH GFR
ALT: 13 U/L (ref 0–35)
AST: 19 U/L (ref 0–37)
Albumin: 4.2 g/dL (ref 3.5–5.2)
Alkaline Phosphatase: 68 U/L (ref 39–117)
BUN: 17 mg/dL (ref 6–23)
CO2: 28 meq/L (ref 19–32)
Calcium: 9.3 mg/dL (ref 8.4–10.5)
Chloride: 100 meq/L (ref 96–112)
Creatinine, Ser: 0.77 mg/dL (ref 0.40–1.20)
GFR: 75.75 mL/min (ref >60.00)
Glucose, Bld: 80 mg/dL (ref 70–99)
Potassium: 4 meq/L (ref 3.5–5.1)
Sodium: 137 meq/L (ref 135–145)
Total Bilirubin: 0.4 mg/dL (ref 0.2–1.2)
Total Protein: 6.7 g/dL (ref 6.0–8.3)

## 2024-07-10 LAB — URINALYSIS, ROUTINE W REFLEX MICROSCOPIC
Bilirubin Urine: NEGATIVE
Hgb urine dipstick: NEGATIVE
Ketones, ur: NEGATIVE
Leukocytes,Ua: NEGATIVE
Nitrite: NEGATIVE
RBC / HPF: NONE SEEN (ref 0–?)
Specific Gravity, Urine: <=1.005 — AB (ref 1.000–1.030)
Total Protein, Urine: NEGATIVE
Urine Glucose: NEGATIVE
Urobilinogen, UA: 0.2 (ref 0.0–1.0)
WBC, UA: NONE SEEN (ref 0–?)
pH: 6 (ref 5.0–8.0)

## 2024-07-10 LAB — VITAMIN B12: Vitamin B-12: 398 pg/mL (ref 211–911)

## 2024-07-10 LAB — HEMOGLOBIN A1C: Hgb A1c MFr Bld: 6.2 % (ref 4.6–6.5)

## 2024-07-10 NOTE — Progress Notes (Signed)
 Established Patient Office Visit   Subjective:  Patient ID: Virginia Fox, female    DOB: 1951/01/04  Age: 73 y.o. MRN: [id number]  Chief Complaint  Patient presents with   Medical Management of Chronic Issues    Follow up. Pt is not fasting. Last seen 2023    HPI Encounter Diagnoses  Name Primary?   Immunization due Yes   Normocytic anemia    Screening for diabetes mellitus    For follow-up.  Not seen since 2023.  She is followed by cardiology for atrial fibs, elevated cholesterol and peripheral vascular disease.  Ongoing follow-up with GI for GERD and internal hemorrhoids.  Status post sigmoidoscopy in 2024.  Active physically by walking.  She does have regular dental care.  She lives alone.  Her daughter lives close by.  There are other relatives in the area.  She has been released by GYN for routine follow-up.   Review of Systems  Constitutional: Negative.   HENT:  Positive for hearing loss.   Eyes:  Negative for blurred vision, discharge and redness.  Respiratory: Negative.    Cardiovascular: Negative.   Gastrointestinal:  Negative for abdominal pain.  Genitourinary: Negative.   Musculoskeletal: Negative.  Negative for myalgias.  Skin:  Negative for rash.  Neurological:  Negative for tingling, loss of consciousness and weakness.  Endo/Heme/Allergies:  Negative for polydipsia.      07/10/2024    1:23 PM 07/03/2024    2:20 PM 03/22/2023    2:00 PM  Depression screen PHQ 2/9  Decreased Interest 0 0 0  Down, Depressed, Hopeless 0 0 0  PHQ - 2 Score 0 0 0  Altered sleeping  0   Tired, decreased energy  0   Change in appetite  0   Feeling bad or failure about yourself   0   Trouble concentrating  0   Moving slowly or fidgety/restless  0   Suicidal thoughts  0   PHQ-9 Score  0   Difficult doing work/chores  Not difficult at all       Current Outpatient Medications:    apixaban  (ELIQUIS ) 5 MG TABS tablet, Take 1 tablet by mouth twice daily, Disp: 60 tablet,  Rfl: 5   Azelaic Acid 15 % gel, SMARTSIG:1 sparingly Topical Daily, Disp: , Rfl:    ezetimibe  (ZETIA ) 10 MG tablet, Take 1 tablet (10 mg total) by mouth daily., Disp: 90 tablet, Rfl: 3   flecainide  (TAMBOCOR ) 50 MG tablet, Take 1 tablet (50 mg total) by mouth 2 (two) times daily., Disp: 60 tablet, Rfl: 6   Loratadine 10 MG CAPS, Take 10 mg by mouth daily as needed (allergies)., Disp: , Rfl:    metoprolol  tartrate (LOPRESSOR ) 25 MG tablet, Take 1 tablet (25 mg total) by mouth 2 (two) times daily., Disp: 180 tablet, Rfl: 3   Multiple Vitamins-Minerals (MULTIVITAMIN WITH MINERALS) tablet, Take 1 tablet by mouth daily., Disp: , Rfl:    omeprazole  (PRILOSEC) 40 MG capsule, Take 1 capsule (40 mg total) by mouth daily., Disp: 90 capsule, Rfl: 1   Polyethyl Glyc-Propyl Glyc PF (SYSTANE ULTRA PF) 0.4-0.3 % SOLN, Place 1 drop into both eyes as needed., Disp: , Rfl:    Objective:     BP 118/72 (BP Location: Right Arm, Patient Position: Sitting, Cuff Size: Normal)   Pulse 63   Temp (!) 96.9 F (36.1 C) (Temporal)   Ht 5' 4 (1.626 m)   Wt 135 lb 12.8 oz (61.6 kg)   SpO2 97%  BMI 23.31 kg/m    Physical Exam Constitutional:      General: She is not in acute distress.    Appearance: Normal appearance. She is not ill-appearing, toxic-appearing or diaphoretic.  HENT:     Head: Normocephalic and atraumatic.     Right Ear: Tympanic membrane, ear canal and external ear normal.     Left Ear: Tympanic membrane, ear canal and external ear normal.     Mouth/Throat:     Mouth: Mucous membranes are moist.     Pharynx: Oropharynx is clear. No oropharyngeal exudate or posterior oropharyngeal erythema.  Eyes:     General: No scleral icterus.       Right eye: No discharge.        Left eye: No discharge.     Extraocular Movements: Extraocular movements intact.     Conjunctiva/sclera: Conjunctivae normal.     Pupils: Pupils are equal, round, and reactive to light.  Cardiovascular:     Rate and Rhythm:  Normal rate and regular rhythm.  Pulmonary:     Effort: Pulmonary effort is normal. No respiratory distress.     Breath sounds: Normal breath sounds. No wheezing or rales.  Abdominal:     General: Bowel sounds are normal.  Musculoskeletal:     Cervical back: No rigidity or tenderness.  Lymphadenopathy:     Cervical: No cervical adenopathy.  Skin:    General: Skin is warm and dry.  Neurological:     Mental Status: She is alert and oriented to person, place, and time.  Psychiatric:        Mood and Affect: Mood normal.        Behavior: Behavior normal.      No results found for any visits on 07/10/24.    The 10-year ASCVD risk score (Arnett DK, et al., 2019) is: 12.2%    Assessment & Plan:   Immunization due -     Pneumococcal conjugate vaccine 20-valent  Normocytic anemia -     CBC with Differential/Platelet -     Vitamin B12 -     Iron, TIBC and Ferritin Panel  Screening for diabetes mellitus -     Comprehensive metabolic panel with GFR -     Urinalysis, Routine w reflex microscopic -     Hemoglobin A1c    Return in about 1 year (around 07/10/2025), or Please have TDap through your pharmacy.SABRA Elsie Sim Berneta, MD

## 2024-07-11 ENCOUNTER — Ambulatory Visit: Payer: Self-pay | Admitting: Family Medicine

## 2024-07-11 LAB — IRON,TIBC AND FERRITIN PANEL
%SAT: 20 % (ref 16–45)
Ferritin: 53 ng/mL (ref 16–288)
Iron: 69 ug/dL (ref 45–160)
TIBC: 337 ug/dL (ref 250–450)

## 2024-08-10 DIAGNOSIS — L821 Other seborrheic keratosis: Secondary | ICD-10-CM | POA: Diagnosis not present

## 2024-08-10 DIAGNOSIS — Z85828 Personal history of other malignant neoplasm of skin: Secondary | ICD-10-CM | POA: Diagnosis not present

## 2024-08-10 DIAGNOSIS — D2371 Other benign neoplasm of skin of right lower limb, including hip: Secondary | ICD-10-CM | POA: Diagnosis not present

## 2024-08-10 DIAGNOSIS — L814 Other melanin hyperpigmentation: Secondary | ICD-10-CM | POA: Diagnosis not present

## 2024-08-10 DIAGNOSIS — D225 Melanocytic nevi of trunk: Secondary | ICD-10-CM | POA: Diagnosis not present

## 2024-08-10 DIAGNOSIS — Z08 Encounter for follow-up examination after completed treatment for malignant neoplasm: Secondary | ICD-10-CM | POA: Diagnosis not present

## 2024-08-10 DIAGNOSIS — L578 Other skin changes due to chronic exposure to nonionizing radiation: Secondary | ICD-10-CM | POA: Diagnosis not present

## 2024-09-15 ENCOUNTER — Other Ambulatory Visit (HOSPITAL_COMMUNITY): Payer: Self-pay | Admitting: Physician Assistant

## 2024-10-26 ENCOUNTER — Other Ambulatory Visit: Payer: Self-pay | Admitting: Cardiology

## 2024-10-26 NOTE — Telephone Encounter (Signed)
 Prescription refill request for Eliquis  received. Indication:afib Last office visit:7/25 Scr: 0.77  8/25 Age:73 Weight:61.6  kg  Prescription refilled

## 2024-12-04 ENCOUNTER — Ambulatory Visit (HOSPITAL_COMMUNITY): Admitting: Physician Assistant

## 2024-12-06 ENCOUNTER — Other Ambulatory Visit: Payer: Self-pay

## 2024-12-11 ENCOUNTER — Other Ambulatory Visit: Payer: Self-pay | Admitting: Family Medicine

## 2024-12-11 DIAGNOSIS — Z1231 Encounter for screening mammogram for malignant neoplasm of breast: Secondary | ICD-10-CM

## 2024-12-15 ENCOUNTER — Other Ambulatory Visit: Payer: Self-pay

## 2024-12-15 ENCOUNTER — Other Ambulatory Visit: Payer: Self-pay | Admitting: Family Medicine

## 2024-12-15 ENCOUNTER — Other Ambulatory Visit (HOSPITAL_COMMUNITY): Payer: Self-pay

## 2024-12-15 DIAGNOSIS — M810 Age-related osteoporosis without current pathological fracture: Secondary | ICD-10-CM

## 2024-12-15 MED ORDER — PROLIA 60 MG/ML ~~LOC~~ SOSY
60.0000 mg | PREFILLED_SYRINGE | Freq: Once | SUBCUTANEOUS | 0 refills | Status: DC
  Filled 2024-12-15 – 2024-12-18 (×2): qty 1, 1d supply, fill #0
  Filled 2024-12-22: qty 1, 180d supply, fill #0

## 2024-12-18 ENCOUNTER — Other Ambulatory Visit (HOSPITAL_COMMUNITY): Payer: Self-pay

## 2024-12-19 ENCOUNTER — Other Ambulatory Visit: Payer: Self-pay

## 2024-12-19 ENCOUNTER — Telehealth: Payer: Self-pay

## 2024-12-19 ENCOUNTER — Other Ambulatory Visit (HOSPITAL_COMMUNITY): Payer: Self-pay

## 2024-12-19 NOTE — Telephone Encounter (Signed)
 Prolia  VOB initiated via [email or web address]  Next Prolia  inj DUE: 12/23/24

## 2024-12-19 NOTE — Telephone Encounter (Signed)
 Prolia  no longer covered. [facility] is preferred, but copay is $519.79

## 2024-12-19 NOTE — Telephone Encounter (Signed)
 Benefit verification started in new encounter.

## 2024-12-20 ENCOUNTER — Other Ambulatory Visit: Payer: Self-pay

## 2024-12-20 ENCOUNTER — Other Ambulatory Visit (HOSPITAL_COMMUNITY): Payer: Self-pay

## 2024-12-20 NOTE — Telephone Encounter (Signed)
 Pt ready for scheduling for PROLIA  on or after : 12/23/24  Option# 1: Buy/Bill (Office supplied medication)  Out-of-pocket cost due at time of clinic visit: $352  Number of injection/visits approved: ---  Primary: [employer] Prolia  co-insurance: 20% Admin fee co-insurance: 0%  Secondary: --- Prolia  co-insurance:  Admin fee co-insurance:   Medical Benefit Details: Date Benefits were checked: 12/19/24 Deductible: NO/ Coinsurance: 20%/ Admin Fee: 0%  Prior Auth: N/A PA# Expiration Date:   # of doses approved: ----------------------------------------------------------------------- Option# 2- Med Obtained from pharmacy: [facility] PREFERRED FOR PHARMACY BENEFIT  Pharmacy benefit: Copay $519.79 (Paid to pharmacy) Admin Fee: 0% (Pay at clinic)  Prior Auth: N/A PA# Expiration Date:   # of doses approved:   If patient wants fill through the pharmacy benefit please send prescription to: [facility], and include estimated need by date in rx notes. Pharmacy will ship medication directly to the office.  Patient NOT eligible for Prolia  Copay Card. Copay Card can make patient's cost as little as $25. Link to apply: [email or web address]://[email or web address]  ** This summary of benefits is an estimation of the patient's out-of-pocket cost. Exact cost may very based on individual plan coverage.

## 2024-12-22 ENCOUNTER — Ambulatory Visit
Admission: RE | Admit: 2024-12-22 | Discharge: 2024-12-22 | Disposition: A | Discharge status: Home / Self Care | Source: Ambulatory Visit | Attending: Family Medicine | Admitting: Family Medicine

## 2024-12-22 ENCOUNTER — Other Ambulatory Visit: Payer: Self-pay

## 2024-12-22 DIAGNOSIS — Z1231 Encounter for screening mammogram for malignant neoplasm of breast: Secondary | ICD-10-CM

## 2024-12-25 ENCOUNTER — Other Ambulatory Visit (HOSPITAL_COMMUNITY): Payer: Self-pay

## 2024-12-26 ENCOUNTER — Other Ambulatory Visit: Payer: Self-pay | Admitting: Family Medicine

## 2024-12-26 ENCOUNTER — Other Ambulatory Visit (HOSPITAL_COMMUNITY): Payer: Self-pay

## 2024-12-26 ENCOUNTER — Other Ambulatory Visit: Payer: Self-pay

## 2024-12-26 DIAGNOSIS — M810 Age-related osteoporosis without current pathological fracture: Secondary | ICD-10-CM

## 2024-12-26 MED ORDER — PROLIA 60 MG/ML ~~LOC~~ SOSY
60.0000 mg | PREFILLED_SYRINGE | Freq: Once | SUBCUTANEOUS | 0 refills | Status: DC
  Filled 2024-12-27: qty 1, 1d supply, fill #0

## 2024-12-26 NOTE — Telephone Encounter (Signed)
 Left patient voicemail for return call regarding Prolia 

## 2024-12-26 NOTE — Progress Notes (Signed)
 Benefits investigation complete. Buy and bill is cheaper for the patient. Dis-enrolling.

## 2024-12-26 NOTE — Progress Notes (Signed)
 error

## 2024-12-27 ENCOUNTER — Other Ambulatory Visit: Payer: Self-pay | Admitting: Internal Medicine

## 2024-12-27 ENCOUNTER — Other Ambulatory Visit: Payer: Self-pay

## 2024-12-27 NOTE — Telephone Encounter (Signed)
 Spoke with pt regarding Prolia  injection, explained all that has taken place and the clarification we are waiting to receive from pharm team regarding insurance coverage. Advised pt we will keep her in the loop and to reach out if she has questions.

## 2025-01-11 ENCOUNTER — Ambulatory Visit (HOSPITAL_COMMUNITY)
Admission: RE | Admit: 2025-01-11 | Discharge: 2025-01-11 | Attending: Physician Assistant | Admitting: Physician Assistant

## 2025-01-11 VITALS — BP 160/68 | HR 61 | Ht 64.0 in | Wt 140.4 lb

## 2025-01-11 DIAGNOSIS — I480 Paroxysmal atrial fibrillation: Secondary | ICD-10-CM | POA: Diagnosis not present

## 2025-01-11 DIAGNOSIS — D6869 Other thrombophilia: Secondary | ICD-10-CM | POA: Diagnosis not present

## 2025-01-11 DIAGNOSIS — Z5181 Encounter for therapeutic drug level monitoring: Secondary | ICD-10-CM | POA: Diagnosis not present

## 2025-01-11 DIAGNOSIS — R0683 Snoring: Secondary | ICD-10-CM

## 2025-01-11 DIAGNOSIS — Z79899 Other long term (current) drug therapy: Secondary | ICD-10-CM | POA: Diagnosis not present

## 2025-01-11 DIAGNOSIS — I4891 Unspecified atrial fibrillation: Secondary | ICD-10-CM | POA: Diagnosis not present

## 2025-01-11 NOTE — Progress Notes (Signed)
 "   Primary Care Physician: Berneta Elsie Sayre, MD Primary Cardiologist: Newman JINNY Lawrence, MD Electrophysiologist: None  Referring Physician: Dr Shlomo Deeann MARLA Bing is a 74 y.o. female with a history of aortic atherosclerosis, atrial flutter, atrial fibrillation who presents for follow up in the [facility].  The patient wore a cardiac monitor 01/2024 which detected atrial flutter. On the evening of 01/30/24, patient reports sudden onset rapid HR >120bpm. Symptoms persisted and she appropriately presented to the [facility] ED where ECG showed afib with RVR. She was transferred to [facility] and converted to SR en route. She was discharged on oral diltiazem . Patient is on Eliquis  for stroke prevention. Started on flecainide  02/22/24. ETT showed no QRS widening or induced arrhythmias on flecainide .   Patient returns for follow up for atrial fibrillation and flecainide  monitoring. Discussed the use of AI scribe software for clinical note transcription with the patient, who gave verbal consent to proceed.  Patient is in SR today and feels well. She experiences episodes of atrial fibrillation at night, occurring a couple of times since her last visit. These episodes last approximately half an hour and are characterized by a 'fluttery sensation' and overall achiness. These episodes do not wake her from sleep, as she is already awake when they occur. She has not noticed any significant bleeding issues with her blood thinner medication, except for occasional bruising on her arms, which she attributes to minor bumps and scratches.  She has not had a sleep study done previously and reports snoring at night, sometimes waking herself up, especially when lying on her back.      Today, she  denies symptoms of chest pain, shortness of breath, orthopnea, PND, lower extremity edema, dizziness, presyncope, syncope, daytime somnolence, bleeding, or neurologic sequela. The patient is  tolerating medications without difficulties and is otherwise without complaint today.    Atrial Fibrillation Risk Factors:  she does have symptoms or diagnosis of sleep apnea. she does not have a history of rheumatic fever. she does not have a history of alcohol use. The patient does not have a history of early familial atrial fibrillation or other arrhythmias. Sister has afib.   Atrial Fibrillation Management history:  Previous antiarrhythmic drugs: flecainide   Previous cardioversions: none Previous ablations: none Anticoagulation history: Eliquis   ROS- All systems are reviewed and negative except as per the HPI above.  Past Medical History:  Diagnosis Date   A-fib [facility]) 2024   Allergy    Anal fissure    Atherosclerosis of aorta    seem on CT scan   Cataract    bilateral cataracts removed   GERD (gastroesophageal reflux disease)    Heart murmur    Hiatal hernia    Internal hemorrhoids    Osteoporosis     Current Outpatient Medications  Medication Sig Dispense Refill   apixaban  (ELIQUIS ) 5 MG TABS tablet Take 1 tablet by mouth twice daily 60 tablet 5   Azelaic Acid 15 % gel SMARTSIG:1 sparingly Topical Daily     ezetimibe  (ZETIA ) 10 MG tablet Take 1 tablet (10 mg total) by mouth daily. 90 tablet 3   flecainide  (TAMBOCOR ) 50 MG tablet Take 1 tablet by mouth twice daily 60 tablet 5   Loratadine 10 MG CAPS Take 10 mg by mouth daily as needed (allergies). (Patient taking differently: Take 10 mg by mouth as needed (allergies).)     metoprolol  tartrate (LOPRESSOR ) 25 MG tablet Take 1 tablet (25 mg total) by  mouth 2 (two) times daily. 180 tablet 3   Multiple Vitamins-Minerals (MULTIVITAMIN WITH MINERALS) tablet Take 1 tablet by mouth daily. (Patient taking differently: Take 1 tablet by mouth 3 (three) times a week.)     omeprazole  (PRILOSEC) 20 MG capsule Take 20 mg by mouth daily.     Polyethyl Glyc-Propyl Glyc PF (SYSTANE ULTRA PF) 0.4-0.3 % SOLN Place 1 drop into both eyes as  needed.     omeprazole  (PRILOSEC) 40 MG capsule Take 1 capsule by mouth once daily (Patient not taking: Reported on 01/11/2025) 90 capsule 0   No current facility-administered medications for this encounter.    Physical Exam: BP (!) 160/68   Pulse 61   Ht 5' 4 (1.626 m)   Wt 63.7 kg   BMI 24.10 kg/m   GEN: Well nourished, well developed in no acute distress CARDIAC: Regular rate and rhythm, no murmurs, rubs, gallops RESPIRATORY:  Clear to auscultation without rales, wheezing or rhonchi  ABDOMEN: Soft, non-tender, non-distended EXTREMITIES:  No edema; No deformity    Wt Readings from Last 3 Encounters:  01/11/25 63.7 kg  07/10/24 61.6 kg  06/30/24 61.5 kg     EKG Interpretation Date/Time:  Thursday January 11 2025 11:45:06 EST Ventricular Rate:  61 PR Interval:  172 QRS Duration:  96 QT Interval:  392 QTC Calculation: 394 R Axis:   82  Text Interpretation: Normal sinus rhythm Normal ECG When compared with ECG of 15-Jun-2024 11:45, No significant change was found Confirmed by Patsy Zaragoza ([id number]) on 01/11/2025 11:48:38 AM    Echo 01/20/24 demonstrated   1. Left ventricular ejection fraction, by estimation, is 60 to 65%. Left  ventricular ejection fraction by 3D volume is 61 %. The left ventricle has  normal function. The left ventricle has no regional wall motion  abnormalities. Left ventricular diastolic parameters are consistent with Grade I diastolic dysfunction (impaired relaxation). The average left ventricular global longitudinal strain is  -19.3 %. The global longitudinal strain is normal.   2. Right ventricular systolic function is normal. The right ventricular  size is normal.   3. Left atrial size was mildly dilated.   4. The mitral valve is normal in structure. Trivial mitral valve  regurgitation. No evidence of mitral stenosis.   5. The aortic valve is tricuspid. Aortic valve regurgitation is not  visualized. Aortic valve sclerosis/calcification is present,  without any  evidence of aortic stenosis.   6. The inferior vena cava is normal in size with greater than 50%  respiratory variability, suggesting right atrial pressure of 3 mmHg.    CHA2DS2-VASc Score = 3  The patient's score is based upon: CHF History: 0 HTN History: 0 Diabetes History: 0 Stroke History: 0 Vascular Disease History: 1 (aortic atherosclerosis) Age Score: 1 Gender Score: 1       ASSESSMENT AND PLAN: Paroxysmal Atrial Fibrillation/atrial flutter (ICD10:  I48.0) The patient's CHA2DS2-VASc score is 3, indicating a 3.2% annual risk of stroke.   Patient appears to be maintaining SR with infrequent episodes.  Continue flecainide  50 mg BID. Can consider increasing if episodes become more frequent.  Continue Lopressor  25 mg BID Continue Eliquis  5 mg BID  Secondary Hypercoagulable State (ICD10:  D68.69) The patient is at significant risk for stroke/thromboembolism based upon her CHA2DS2-VASc Score of 3.  Continue Apixaban  (Eliquis ). No bleeding issues.   High Risk Medication Monitoring (ICD 10: [id number]) Patient requires ongoing monitoring for anti-arrhythmic medication which has the potential to cause life threatening arrhythmias. Intervals on ECG  acceptable for flecainide  monitoring.     Elevated BP BP is elevated today. She has a BP machine at home and will keep BP log for review at her next visit.   Snoring/suspected OSA  The importance of adequate treatment of sleep apnea was discussed today in order to improve our ability to maintain sinus rhythm long term. Will refer for sleep study.    Follow up with Dr Elmira as scheduled. AF clinic in one year.     Daril Kicks PA-C Af[facility] [address] [place], [place] [address] [phone] "

## 2025-02-09 ENCOUNTER — Ambulatory Visit: Admitting: Cardiology

## 2025-07-06 ENCOUNTER — Ambulatory Visit
